# Patient Record
Sex: Female | Born: 1940 | Race: White | Hispanic: No | Marital: Single | Smoking: Never smoker
Health system: Southern US, Community
[De-identification: ages and names within clinical notes are randomized; demographics above are authoritative.]

## PROBLEM LIST (undated history)

## (undated) DIAGNOSIS — R0602 Shortness of breath: Secondary | ICD-10-CM

## (undated) DIAGNOSIS — D649 Anemia, unspecified: Secondary | ICD-10-CM

## (undated) DIAGNOSIS — IMO0001 Reserved for inherently not codable concepts without codable children: Secondary | ICD-10-CM

## (undated) DIAGNOSIS — I739 Peripheral vascular disease, unspecified: Secondary | ICD-10-CM

## (undated) DIAGNOSIS — J189 Pneumonia, unspecified organism: Secondary | ICD-10-CM

## (undated) DIAGNOSIS — J209 Acute bronchitis, unspecified: Secondary | ICD-10-CM

## (undated) DIAGNOSIS — M1990 Unspecified osteoarthritis, unspecified site: Secondary | ICD-10-CM

## (undated) DIAGNOSIS — E041 Nontoxic single thyroid nodule: Secondary | ICD-10-CM

## (undated) DIAGNOSIS — I10 Essential (primary) hypertension: Secondary | ICD-10-CM

## (undated) HISTORY — DX: Acute bronchitis, unspecified: J20.9

## (undated) HISTORY — DX: Peripheral vascular disease, unspecified: I73.9

## (undated) HISTORY — DX: Nontoxic single thyroid nodule: E04.1

## (undated) HISTORY — DX: Unspecified osteoarthritis, unspecified site: M19.90

## (undated) HISTORY — DX: Shortness of breath: R06.02

## (undated) HISTORY — DX: Reserved for inherently not codable concepts without codable children: IMO0001

## (undated) HISTORY — DX: Pneumonia, unspecified organism: J18.9

## (undated) HISTORY — DX: Anemia, unspecified: D64.9

## (undated) HISTORY — DX: Essential (primary) hypertension: I10

---

## 1999-05-16 HISTORY — PX: CHOLECYSTECTOMY: SHX55

## 2010-08-14 ENCOUNTER — Encounter: Payer: Self-pay | Admitting: Internal Medicine

## 2010-08-15 ENCOUNTER — Inpatient Hospital Stay (HOSPITAL_COMMUNITY)
Admission: RE | Admit: 2010-08-15 | Discharge: 2010-08-18 | Payer: Self-pay | Source: Home / Self Care | Admitting: Specialist

## 2010-10-20 ENCOUNTER — Other Ambulatory Visit (HOSPITAL_COMMUNITY): Payer: Self-pay | Admitting: Specialist

## 2010-10-20 ENCOUNTER — Ambulatory Visit (HOSPITAL_COMMUNITY)
Admission: RE | Admit: 2010-10-20 | Discharge: 2010-10-20 | Disposition: A | Discharge status: Home / Self Care | Payer: PRIVATE HEALTH INSURANCE | Source: Ambulatory Visit | Attending: Specialist | Admitting: Specialist

## 2010-10-20 ENCOUNTER — Ambulatory Visit (HOSPITAL_BASED_OUTPATIENT_CLINIC_OR_DEPARTMENT_OTHER)
Admission: RE | Admit: 2010-10-20 | Discharge: 2010-10-20 | Disposition: A | Discharge status: Home / Self Care | Payer: PRIVATE HEALTH INSURANCE | Attending: Specialist | Admitting: Specialist

## 2010-10-20 DIAGNOSIS — Z01818 Encounter for other preprocedural examination: Secondary | ICD-10-CM | POA: Insufficient documentation

## 2010-10-20 DIAGNOSIS — Z96659 Presence of unspecified artificial knee joint: Secondary | ICD-10-CM | POA: Insufficient documentation

## 2010-10-20 DIAGNOSIS — M24669 Ankylosis, unspecified knee: Secondary | ICD-10-CM | POA: Insufficient documentation

## 2010-10-20 DIAGNOSIS — Z01812 Encounter for preprocedural laboratory examination: Secondary | ICD-10-CM | POA: Insufficient documentation

## 2010-10-20 DIAGNOSIS — M4120 Other idiopathic scoliosis, site unspecified: Secondary | ICD-10-CM | POA: Insufficient documentation

## 2010-10-20 LAB — POCT I-STAT, CHEM 8
Calcium, Ion: 1.1 mmol/L — ABNORMAL LOW (ref 1.12–1.32)
Creatinine, Ser: 0.7 mg/dL (ref 0.4–1.2)
Glucose, Bld: 103 mg/dL — ABNORMAL HIGH (ref 70–99)
HCT: 41 % (ref 36.0–46.0)
Hemoglobin: 13.9 g/dL (ref 12.0–15.0)

## 2010-10-28 NOTE — Op Note (Signed)
  NAME:  Beverly Hart, Beverly Hart                ACCOUNT NO.:  [id number]  MEDICAL RECORD NO.:  [id number]           PATIENT TYPE:  O  LOCATION:  XRAY                         FACILITY:  [facility]  PHYSICIAN:  Jene Every, M.D.    DATE OF BIRTH:  [birth year]  DATE OF PROCEDURE: DATE OF DISCHARGE:                              OPERATIVE REPORT   PREOPERATIVE DIAGNOSIS:  Arthrofibrosis status post total knee replacement.  POSTOPERATIVE DIAGNOSIS:  Arthrofibrosis status post total knee replacement.  PROCEDURE PERFORMED: 1. Manipulation under anesthesia. 2. Intra-articular injection of lidocaine, right knee.  ANESTHESIA:  General.  SURGEON:  Jene Every, M.D.  ASSISTANT SURGEON:  No assistant.  BRIEF HISTORY:  This is a 70 year old who is status post knee replacement, went home and did not participate in physical therapy, developed a knee flexion contracture despite conservative treatment and was indicated for a manipulation.  Risks and benefits were discussed including bleeding, infection, fracture, inability to manipulate, need for open procedure, etc.  TECHNIQUE:  Placed in supine, and after induction with adequate anesthesia, we performed an exam under anesthesia.  She had approximately 85 degrees of flexion and -30 of extension.  I performed a gentle manipulation securing the tibia proximally with the flexed with a gentle axial load achieving 115 degrees of flexion.  I brought her into full extension, and again pressure to the distal femur and proximal tibia achieved at approximately -5 to 10 degrees of full extension. This maneuver was performed both in flexion/extension over a 10 minute period of time to achieve maximal result.  No fracture was appreciated. No difficulty achieving the motion.  Leg lengths were equivalent following that, and good pulses following that.  I sterilely prepped the knee in flexion and injected her with 10 mL of 1% lidocaine.  I placed a dressing, Ace, and  knee immobilizer.  She was transported to the recovery room in satisfactory condition.  The patient tolerated the procedure well.  No complications.  She will see a therapist tomorrow.     Jene Every, M.D.     Cordelia Pen  D:  10/20/2010  T:  10/20/2010  Job:  [id number]  Electronically Signed by Jene Every M.D. on 10/28/2010 12:17:11 PM

## 2010-11-25 LAB — URINALYSIS, ROUTINE W REFLEX MICROSCOPIC
Hgb urine dipstick: NEGATIVE
Nitrite: NEGATIVE
Protein, ur: NEGATIVE mg/dL
Urobilinogen, UA: 0.2 mg/dL (ref 0.0–1.0)

## 2010-11-25 LAB — CBC
HCT: 26.4 % — ABNORMAL LOW (ref 36.0–46.0)
HCT: 27.5 % — ABNORMAL LOW (ref 36.0–46.0)
HCT: 30.5 % — ABNORMAL LOW (ref 36.0–46.0)
Hemoglobin: 10.1 g/dL — ABNORMAL LOW (ref 12.0–15.0)
Hemoglobin: 8.6 g/dL — ABNORMAL LOW (ref 12.0–15.0)
MCH: 30.3 pg (ref 26.0–34.0)
MCH: 31.1 pg (ref 26.0–34.0)
MCH: 32.1 pg (ref 26.0–34.0)
MCHC: 33.1 g/dL (ref 30.0–36.0)
MCV: 92.7 fL (ref 78.0–100.0)
MCV: 92.9 fL (ref 78.0–100.0)
MCV: 93 fL (ref 78.0–100.0)
MCV: 92.8 fL (ref 78.0–100.0)
Platelets: 294 10*3/uL (ref 150–400)
RBC: 2.84 MIL/uL — ABNORMAL LOW (ref 3.87–5.11)
RBC: 2.96 MIL/uL — ABNORMAL LOW (ref 3.87–5.11)
RDW: 12.2 % (ref 11.5–15.5)
RDW: 13 % (ref 11.5–15.5)
WBC: 9.1 10*3/uL (ref 4.0–10.5)
WBC: 9.9 10*3/uL (ref 4.0–10.5)
WBC: 6.6 10*3/uL (ref 4.0–10.5)

## 2010-11-25 LAB — BASIC METABOLIC PANEL
BUN: 8 mg/dL (ref 6–23)
CO2: 27 mEq/L (ref 19–32)
CO2: 28 mEq/L (ref 19–32)
Chloride: 106 mEq/L (ref 96–112)
Chloride: 106 mEq/L (ref 96–112)
Chloride: 107 mEq/L (ref 96–112)
GFR calc Af Amer: >60 mL/min (ref >60)
GFR calc Af Amer: >60 mL/min (ref >60)
GFR calc non Af Amer: >60 mL/min (ref >60)
Glucose, Bld: 103 mg/dL — ABNORMAL HIGH (ref 70–99)
Glucose, Bld: 134 mg/dL — ABNORMAL HIGH (ref 70–99)
Potassium: 3.7 mEq/L (ref 3.5–5.1)
Potassium: 3.9 mEq/L (ref 3.5–5.1)
Potassium: 4.4 mEq/L (ref 3.5–5.1)
Sodium: 139 mEq/L (ref 135–145)
Sodium: 141 mEq/L (ref 135–145)

## 2010-11-25 LAB — COMPREHENSIVE METABOLIC PANEL
Albumin: 4.2 g/dL (ref 3.5–5.2)
BUN: 11 mg/dL (ref 6–23)
Creatinine, Ser: 0.61 mg/dL (ref 0.4–1.2)
Potassium: 4.5 mEq/L (ref 3.5–5.1)
Total Protein: 7.6 g/dL (ref 6.0–8.3)

## 2010-11-25 LAB — DIFFERENTIAL
Lymphocytes Relative: 28 % (ref 12–46)
Monocytes Absolute: 0.3 10*3/uL (ref 0.1–1.0)
Monocytes Relative: 5 % (ref 3–12)
Neutro Abs: 4.4 10*3/uL (ref 1.7–7.7)

## 2010-11-25 LAB — SURGICAL PCR SCREEN
MRSA, PCR: NEGATIVE
Staphylococcus aureus: NEGATIVE

## 2010-11-25 LAB — APTT: aPTT: 34 seconds (ref 24–37)

## 2010-11-25 LAB — PROTIME-INR
INR: 1.13 (ref 0.00–1.49)
Prothrombin Time: 23.9 seconds — ABNORMAL HIGH (ref 11.6–15.2)

## 2010-11-25 LAB — HEMOGLOBIN AND HEMATOCRIT, BLOOD: HCT: 29.1 % — ABNORMAL LOW (ref 36.0–46.0)

## 2010-11-25 LAB — TYPE AND SCREEN

## 2011-10-12 ENCOUNTER — Encounter: Payer: Self-pay | Admitting: Internal Medicine

## 2011-10-15 ENCOUNTER — Encounter: Payer: Self-pay | Admitting: Internal Medicine

## 2011-11-13 ENCOUNTER — Institutional Professional Consult (permissible substitution): Payer: PRIVATE HEALTH INSURANCE | Admitting: Internal Medicine

## 2011-11-24 ENCOUNTER — Institutional Professional Consult (permissible substitution): Payer: PRIVATE HEALTH INSURANCE | Admitting: Emergency Medicine

## 2011-11-25 ENCOUNTER — Telehealth: Payer: Self-pay | Admitting: Emergency Medicine

## 2011-11-25 NOTE — Telephone Encounter (Signed)
Phone note opened in error.  

## 2011-11-26 ENCOUNTER — Encounter: Payer: Self-pay | Admitting: Emergency Medicine

## 2011-11-27 ENCOUNTER — Institutional Professional Consult (permissible substitution): Payer: PRIVATE HEALTH INSURANCE | Admitting: Emergency Medicine

## 2011-12-07 ENCOUNTER — Ambulatory Visit (HOSPITAL_COMMUNITY)
Admission: RE | Admit: 2011-12-07 | Discharge: 2011-12-07 | Disposition: A | Discharge status: Home / Self Care | Payer: Commercial Managed Care - PPO | Source: Ambulatory Visit | Attending: Internal Medicine | Admitting: Internal Medicine

## 2011-12-07 ENCOUNTER — Encounter (HOSPITAL_COMMUNITY): Payer: Self-pay

## 2011-12-07 ENCOUNTER — Other Ambulatory Visit: Payer: Self-pay

## 2011-12-07 VITALS — BP 150/68 | HR 65 | Wt 146.5 lb

## 2011-12-07 DIAGNOSIS — R0609 Other forms of dyspnea: Secondary | ICD-10-CM | POA: Insufficient documentation

## 2011-12-07 DIAGNOSIS — I10 Essential (primary) hypertension: Secondary | ICD-10-CM | POA: Insufficient documentation

## 2011-12-07 DIAGNOSIS — I110 Hypertensive heart disease with heart failure: Secondary | ICD-10-CM | POA: Insufficient documentation

## 2011-12-07 DIAGNOSIS — R0989 Other specified symptoms and signs involving the circulatory and respiratory systems: Secondary | ICD-10-CM | POA: Insufficient documentation

## 2011-12-07 DIAGNOSIS — R0600 Dyspnea, unspecified: Secondary | ICD-10-CM | POA: Insufficient documentation

## 2011-12-07 MED ORDER — AMLODIPINE BESYLATE 10 MG PO TABS: 10.0000 mg | ORAL_TABLET | Freq: Every day | ORAL | Status: AC

## 2011-12-07 NOTE — Patient Instructions (Addendum)
Start Amlodipine 10 mg daily  Your physician recommends that you schedule a follow-up appointment in: 3 months

## 2011-12-07 NOTE — Assessment & Plan Note (Signed)
Currently has some fatigue which may be related to clonidine. She does not want to go back on lisinopril. We will try amlodipine 10 mg daily. Cut clonidine to 0.1 daily for 1 week. Then can d/c. Watch BP closely to make sure she doesn't need a 2nd agent.

## 2011-12-07 NOTE — Assessment & Plan Note (Signed)
I suspect most of her dyspnea was related to her PNA and now is improving rapidly. In reviewing her echo report she does appear to have some diastolic dysfunction but volume status currently well controlled and no overt HF at this point. Can continue using lasix as needed to keep weight stable. I suspect she may have some underlying lung disease despite being a life-long non-smoker and we will await PFTs with Dr. Delton Coombes.

## 2011-12-07 NOTE — Progress Notes (Signed)
Patient ID: Beverly Hart, female   DOB: April 08, 1941, 71 y.o.   MRN: [id number]     Referring Physician:  [name], MD Primary Physician: [name], MD Primary Cardiologist: Reason for Consultation: HF   HPI:  Beverly Hart is a 70 y/o woman with h/o HTN, HL and depression. She denies h/o known CAD or HF. Had syncopal episode due to HCTZ in 2004 which was normal. Then had Myoview in 08/2010 (pre-op R TKA) which was normal. Never had a cath.  Admitted to [facility] in 1/13 with respiratory failure due to pneumonia complicated by what appears to be acute diastolic HF and mild renal failure with Cr 1.5. SBP > 200.  Lisinopril stopped and clonidine started.  Echo showed normal LV function + diastolic dysfunction. Normal RV. Mild TR. On d/c summary states she has COPD with flare but denies h/o smoking or asthma. Not a lot of second-hand smoke. On discharge sats went to 78% so was discharged on home O2. Readmitted two weeks later on February 18 with recurrent PNA. Treated again with abx. Weaning O2 now down to 1/2 L of O2 at night. Scheduled to see Dr. Delton Coombes for PFTs and CXR  4/24. Has worked in a Theme park manager for many years.   Denies CP, edema, orthopnea or PND. Says she can walk a pretty good distance without problem. Has not had f/u CXR yet. SBP running 120s at home. Has white-coat syndrome here. Taking lasix every other day. Weighing every day - very stable.     Review of Systems:     Cardiac Review of Systems: {Y] = yes [ ]  = no  Chest Pain [    ]  Resting SOB [   ] Exertional SOB  [  ]  Orthopnea [  ]   Pedal Edema [   ]    Palpitations [  ] Syncope  [  ]   Presyncope [   ]  General Review of Systems: [Y] = yes [  ]=no Constitional: recent weight change [  ]; anorexia [  ]; fatigue [  ]; nausea [  ]; night sweats [  ]; fever [  ]; or chills [  ];                                                                                                                 Eye : blurred vision [  ];  diplopia [   ]; vision changes [  ];  Amaurosis fugax[  ]; Resp: cough [  ];  wheezing[  ];  hemoptysis[  ]; shortness of breath[  ]; paroxysmal nocturnal dyspnea[  ]; dyspnea on exertion[  ]; or orthopnea[  ];  GI:  gallstones[  ], vomiting[  ];  dysphagia[  ]; melena[  ];  hematochezia [  ]; heartburn[  ];   Hx of  Colonoscopy[  ]; GU: kidney stones [  ]; hematuria[  ];   dysuria [  ];  nocturia[  ];  history of  obstruction [  ];                 Skin: rash, swelling[  ];, hair loss[  ];  peripheral edema[  ];  or itching[  ]; Musculosketetal: myalgias[  ];  joint swelling[  ];  joint erythema[  ];  joint pain[ y ];  back pain[  ];  Heme/Lymph: bruising[  ];  bleeding[  ];  anemia[  ];  Neuro: TIA[  ];  headaches[  ];  stroke[  ];  vertigo[  ];  seizures[  ];   paresthesias[  ];  difficulty walking[  ];  Psych:depression[y  ]; anxiety[  ];  Endocrine: diabetes[  ];  thyroid dysfunction[  ];  Other:  Past Medical History  Diagnosis Date  . Acute bronchitis   . Anemia   . Community acquired pneumonia   . Hypertension   . Myalgia and myositis   . Osteoarthritis   . Peripheral vascular disease     noted on carotid ultrasound  . SOB (shortness of breath)   . Solitary thyroid nodule     hyperdense on the right lobe     Medications Prior to Admission  Medication Sig Dispense Refill  . calcium carbonate (OS-CAL) 600 MG TABS Take 600 mg by mouth 2 (two) times daily with a meal.      . cholecalciferol (VITAMIN D) 1000 UNITS tablet Take 2,000 Units by mouth daily.       . citalopram (CELEXA) 10 MG tablet Take 10 mg by mouth daily.      . cloNIDine (CATAPRES) 0.1 MG tablet Take 0.1 mg by mouth 2 (two) times daily.      Donald Prose (FLORA-Q) CAPS Take by mouth.      . furosemide (LASIX) 40 MG tablet Take 20 mg by mouth. Every Mon, Wed and Fri      . Glucosamine-Chondroit-Vit C-Mn (GLUCOSAMINE 1500 COMPLEX PO) Take 1,500 mg by mouth.      . Multiple Vitamin (MULTIVITAMIN) capsule Take 1  capsule by mouth daily.       No current facility-administered medications on file as of 12/07/2011.     Allergies  Allergen Reactions  . Penicillins     Rash   . Sulfa Antibiotics     Rash     History   Social History  . Marital Status: Single    Spouse Name: N/A    Number of Children: N/A  . Years of Education: N/A   Occupational History  . Not on file.   Social History Main Topics  . Smoking status: Never Smoker   . Smokeless tobacco: Never Used  . Alcohol Use: No  . Drug Use: No  . Sexually Active: Not on file   Other Topics Concern  . Not on file   Social History Narrative  . No narrative on file    No family history on file.  PHYSICAL EXAM: Filed Vitals:   12/07/11 1459  BP: 150/68  Pulse: 65    No intake or output data in the 24 hours ending 12/07/11 1524  General:  Elderly Well appearing. No respiratory difficulty HEENT: normal. Wearing O2 Neck: supple. no JVD. Carotids 2+ bilat; no bruits. No lymphadenopathy or thryomegaly appreciated. Cor: PMI nondisplaced. Regular rate & rhythm. No rubs or murmurs. +s4 Lungs: clear with long expiratory phase. No wheeze. Abdomen: soft, nontender, nondistended. No hepatosplenomegaly. No bruits or masses. Good bowel sounds. Extremities: no cyanosis, clubbing, rash, edema Neuro: alert & oriented  x 3, cranial nerves grossly intact. moves all 4 extremities w/o difficulty. Affect pleasant.  ECG: Sinus brady 49 No ST-T wave abnormalities.    No results found for this or any previous visit (from the past 24 hour(s)). No results found.   ASSESSMENT:

## 2011-12-09 NOTE — Progress Notes (Signed)
Encounter addended by: Cresenciano Genre on: 12/09/2011  7:46 AM<BR>     Documentation filed: Charges VN

## 2012-01-06 ENCOUNTER — Ambulatory Visit (INDEPENDENT_AMBULATORY_CARE_PROVIDER_SITE_OTHER): Payer: Commercial Managed Care - PPO | Admitting: Emergency Medicine

## 2012-01-06 ENCOUNTER — Encounter: Payer: Self-pay | Admitting: Emergency Medicine

## 2012-01-06 VITALS — BP 120/60 | HR 48 | Temp 98.2°F | Ht 62.0 in | Wt 156.2 lb

## 2012-01-06 DIAGNOSIS — I5030 Unspecified diastolic (congestive) heart failure: Secondary | ICD-10-CM

## 2012-01-06 DIAGNOSIS — R0609 Other forms of dyspnea: Secondary | ICD-10-CM

## 2012-01-06 DIAGNOSIS — R0600 Dyspnea, unspecified: Secondary | ICD-10-CM

## 2012-01-06 NOTE — Progress Notes (Signed)
Subjective:    Patient ID: Beverly Hart, female    DOB: [birth year], 71 y.o.   MRN: [id number]  HPI 71 yo woman, never smoker, hx HTN, hyperlipidemia. Admitted with bilateral infiltrates and hypoxemic resp failure to [facility] in January 2013. She reports that she was well leading up to the day of this admission. She developed acute respiratory distress without any fever, chills, cough, sputum production or other infectious prodrome. On initial evaluation her systolic blood pressure was greater than 200. She was treated for a possible community-acquired pneumonia although her overall presentation appears to be more consistent with acute diastolic dysfunction and pulmonary edema. Her course was complicated by mild renal insufficiency with a serum creatinine of 1.5. Notation was made of possible COPD with acute exacerbation contributing to her decompensation although she has no history of tobacco use or asthma. She improved with diuresis and was discharged home but was unfortunately readmitted 2 weeks later, this time with some cough and associated fever. Her bilateral infiltrates were asymmetric and she was again treated for possible pneumonia.   She has been evaluated by cardiology and has an echocardiogram that showed diastolic dysfunction with preserved LVEF. She had mild TR and her right ventricular function was felt to be normal. She was discharged on home oxygen but has weaned herself off of this. Her exercise tolerance and overall pulmonary status has improved with diuresis and better blood pressure control. She presents now for evaluation of possible underlying lung disease. She is much improved and feels like she is approaching her previous baseline.   Review of Systems  Constitutional: Negative.  Negative for fever and unexpected weight change.  HENT: Positive for congestion. Negative for ear pain, nosebleeds, sore throat, rhinorrhea, sneezing, trouble swallowing, dental problem,  postnasal drip and sinus pressure.   Eyes: Negative.  Negative for redness and itching.  Respiratory: Positive for cough and shortness of breath. Negative for chest tightness and wheezing.   Cardiovascular: Positive for leg swelling. Negative for palpitations.  Gastrointestinal: Negative.  Negative for nausea and vomiting.  Genitourinary: Negative.  Negative for dysuria.  Musculoskeletal: Positive for arthralgias. Negative for joint swelling.  Skin: Negative.  Negative for rash.  Neurological: Negative.  Negative for headaches.  Hematological: Negative.  Does not bruise/bleed easily.  Psychiatric/Behavioral: Negative for dysphoric mood. The patient is nervous/anxious.     Past Medical History  Diagnosis Date  . Acute bronchitis   . Anemia   . Community acquired pneumonia   . Hypertension   . Myalgia and myositis   . Osteoarthritis   . Peripheral vascular disease     noted on carotid ultrasound  . SOB (shortness of breath)   . Solitary thyroid nodule     hyperdense on the right lobe      Family History  Problem Relation Age of Onset  . Emphysema Father   . Breast cancer Mother      History   Social History  . Marital Status: Single    Spouse Name: N/A    Number of Children: N/A  . 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 Smoking status: Never Smoker   . Smokeless tobacco: Never Used  . Alcohol Use: No  . Drug Use: No  . Sexually Active: Not on file   Other Topics Concern  . Not on file   Social History Narrative  . No narrative on file     Allergies  Allergen Reactions  . Penicillins     Rash   . Sulfa Antibiotics     Rash      Outpatient Prescriptions Prior to Visit  Medication Sig Dispense Refill  . aspirin 81 MG tablet Take 81 mg by mouth daily.      Marland Kitchen b complex vitamins tablet Take 2 tablets by mouth daily.      . calcium carbonate (OS-CAL) 600 MG TABS Take 600 mg by mouth 2  (two) times daily with a meal.      . cholecalciferol (VITAMIN D) 1000 UNITS tablet Take 2,000 Units by mouth daily.       . citalopram (CELEXA) 10 MG tablet Take 10 mg by mouth daily.      . cloNIDine (CATAPRES) 0.1 MG tablet Take 0.1 mg by mouth 2 (two) times daily.      . furosemide (LASIX) 40 MG tablet Take 20 mg by mouth. Every Mon, Wed and Fri      . Glucosamine-Chondroit-Vit C-Mn (GLUCOSAMINE 1500 COMPLEX PO) Take 1,500 mg by mouth.      . metoprolol (LOPRESSOR) 50 MG tablet Take 50 mg by mouth 2 (two) times daily.      . Multiple Vitamin (MULTIVITAMIN) capsule Take 1 capsule by mouth daily.      . rosuvastatin (CRESTOR) 10 MG tablet Take 10 mg by mouth daily.      Marland Kitchen amLODipine (NORVASC) 10 MG tablet Take 1 tablet (10 mg total) by mouth daily.  30 tablet  6  . Flora-Q (FLORA-Q) CAPS Take by mouth.      Marland Kitchen LORazepam (ATIVAN) 0.5 MG tablet Take 0.5 mg by mouth at bedtime.            Objective:   Physical Exam  Gen: Pleasant, well-nourished, in no distress,  normal affect  ENT: No lesions,  mouth clear,  oropharynx clear, no postnasal drip  Neck: No JVD, no TMG, no carotid bruits  Lungs: No use of accessory muscles, no dullness to percussion, clear without rales or rhonchi  Cardiovascular: RRR, heart sounds normal, no murmur or gallops, no peripheral edema  Musculoskeletal: No deformities, no cyanosis or clubbing  Neuro: alert, non focal  Skin: Warm, no lesions or rashes     Assessment & Plan:  Dyspnea After reviewing clinical hx and her CXR's I believe her initial admission to [facility] and her dyspnea relate to pulmonary edema due to diastolic failure and hypertensive crisis. In retrospect hard to know if she had PNA although agree she was appropriately treated. She was readmitted after 2 weeks and sounds as though she may have had infectious process on this occasion. Was d/c with O2 and a dx of COPD despite no tobacco hx. Her CXR has cleared.  - full PFT to prove or  disprove AFL - walking oximetry shows that she does not desaturate - BP control and CHF management per Dr Bensimhon's plans

## 2012-01-06 NOTE — Patient Instructions (Signed)
We will perform full Pulmonary Function testing at your next office visit Your oximetry shows that you you do not need oxygen when walking anymore.  Continue your blood pressure meds as directed by Dr Gala Romney.  Follow with Dr Delton Coombes next available with PFT

## 2012-01-06 NOTE — Assessment & Plan Note (Addendum)
After reviewing clinical hx and her CXR's I believe her initial admission to [facility] and her dyspnea relate to pulmonary edema due to diastolic failure and hypertensive crisis. In retrospect hard to know if she had PNA although agree she was appropriately treated. She was readmitted after 2 weeks and sounds as though she may have had infectious process on this occasion. Was d/c with O2 and a dx of COPD despite no tobacco hx. Her CXR has cleared.  - full PFT to prove or disprove AFL - walking oximetry shows that she does not desaturate - BP control and CHF management per Dr Bensimhon's plans

## 2012-01-07 DIAGNOSIS — I5030 Unspecified diastolic (congestive) heart failure: Secondary | ICD-10-CM | POA: Insufficient documentation

## 2012-01-08 ENCOUNTER — Encounter: Payer: Self-pay | Admitting: Internal Medicine

## 2012-01-28 ENCOUNTER — Ambulatory Visit: Payer: Commercial Managed Care - PPO | Admitting: Emergency Medicine

## 2012-02-04 ENCOUNTER — Encounter: Payer: Self-pay | Admitting: Internal Medicine

## 2012-02-11 IMAGING — CR DG KNEE 1-2V*R*
2 series · 2 of 2 positions shown · non-contrast
Comparison: None.

CLINICAL DATA: Preoperative film.

RIGHT KNEE - 1-2 VIEW

[w knee ap right *]
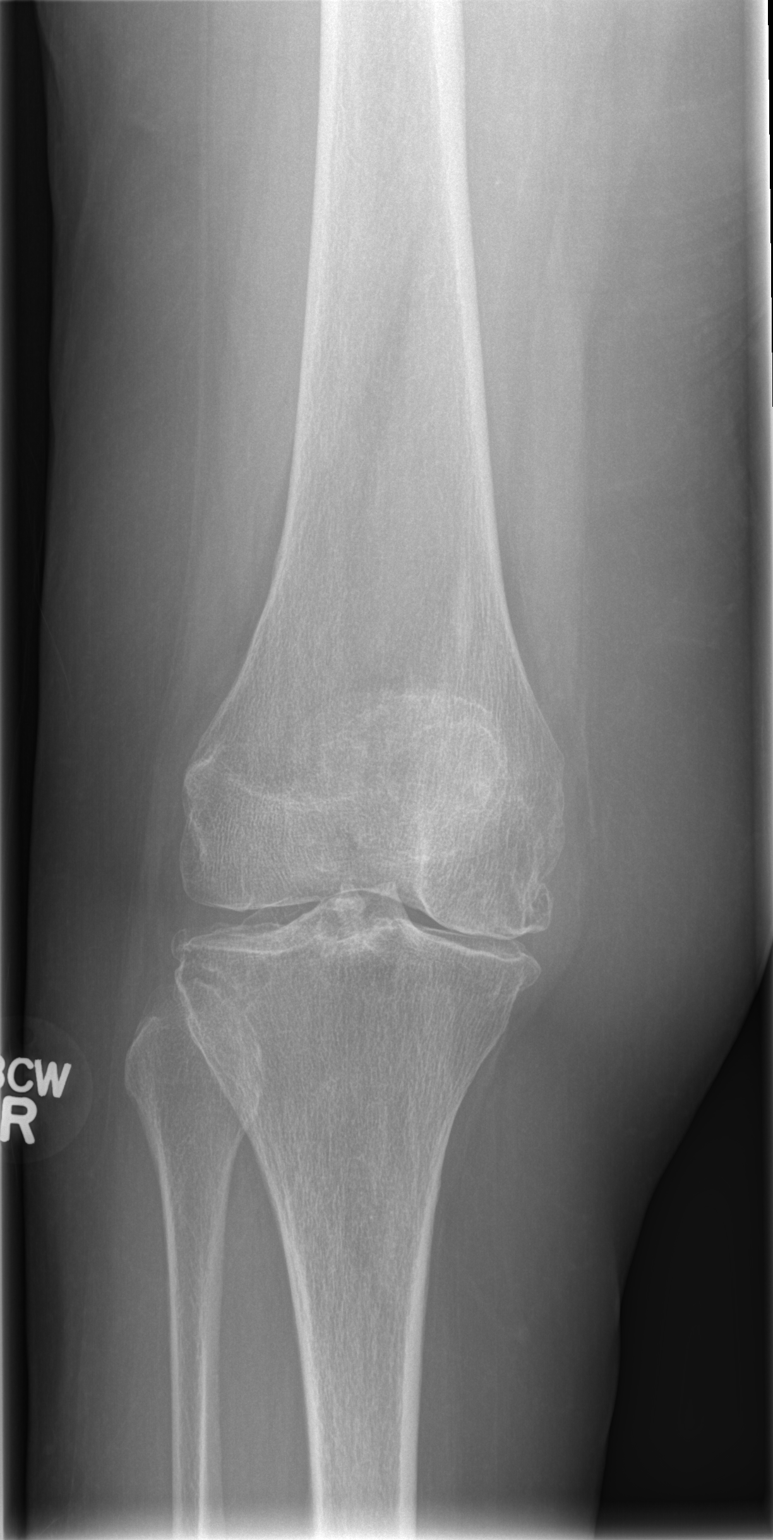

[w knee lat. right]
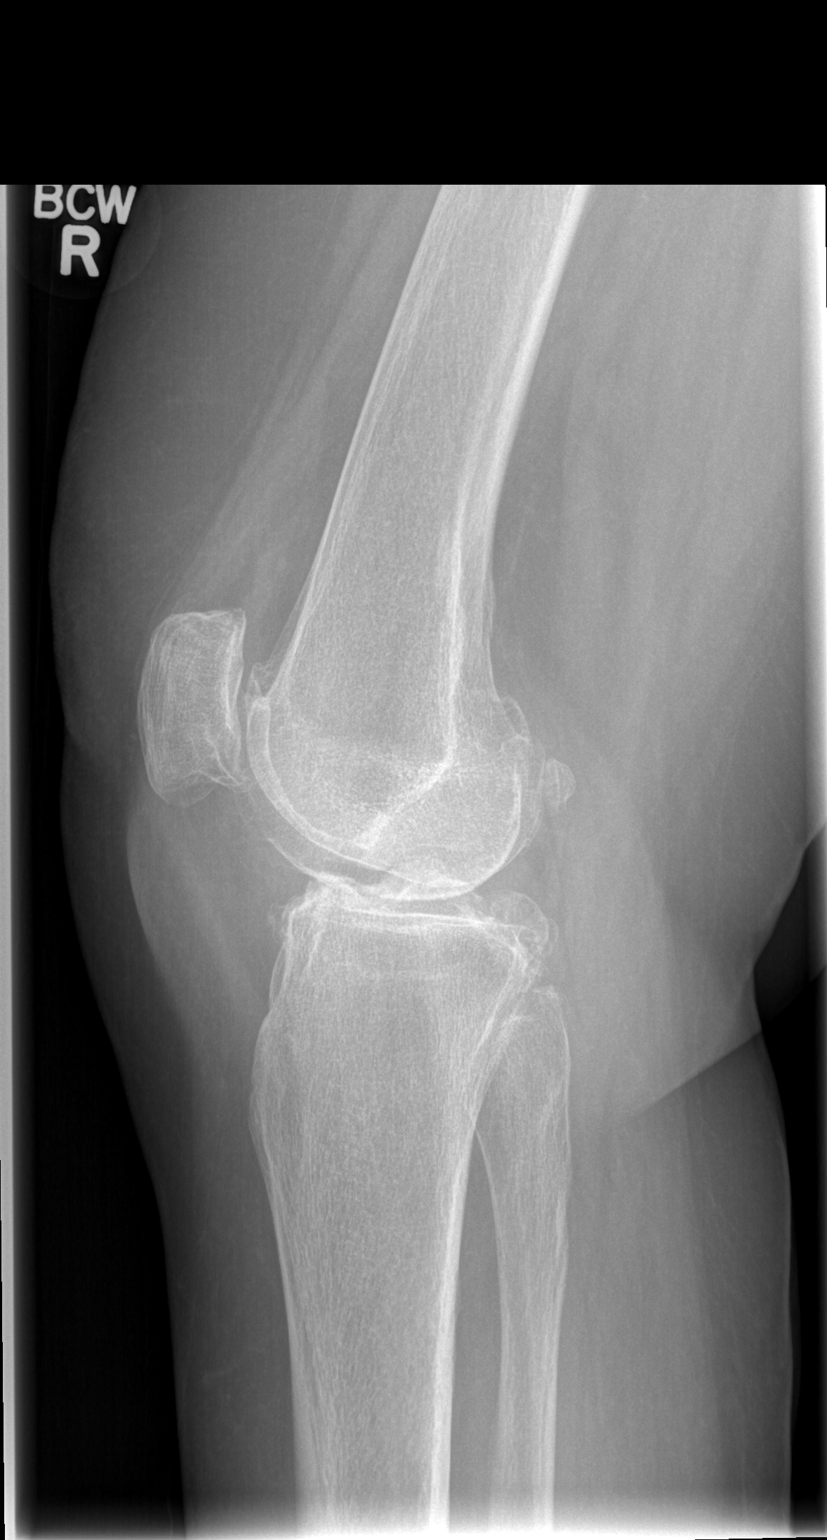

[2 of 2 positions shown; findings below may reference images not displayed]

FINDINGS: No fracture or dislocation is identified.  The patient
has advanced tricompartmental degenerative disease.  Scant amount
of joint fluid noted.
IMPRESSION: 1.  No acute finding.
2.  Advanced osteoarthritis.

## 2012-02-11 IMAGING — CR DG CHEST 2V
2 series · 2 of 2 positions shown · non-contrast
Comparison: None.

CLINICAL DATA: Preoperative film.

CHEST - 2 VIEW

[w chest pa]
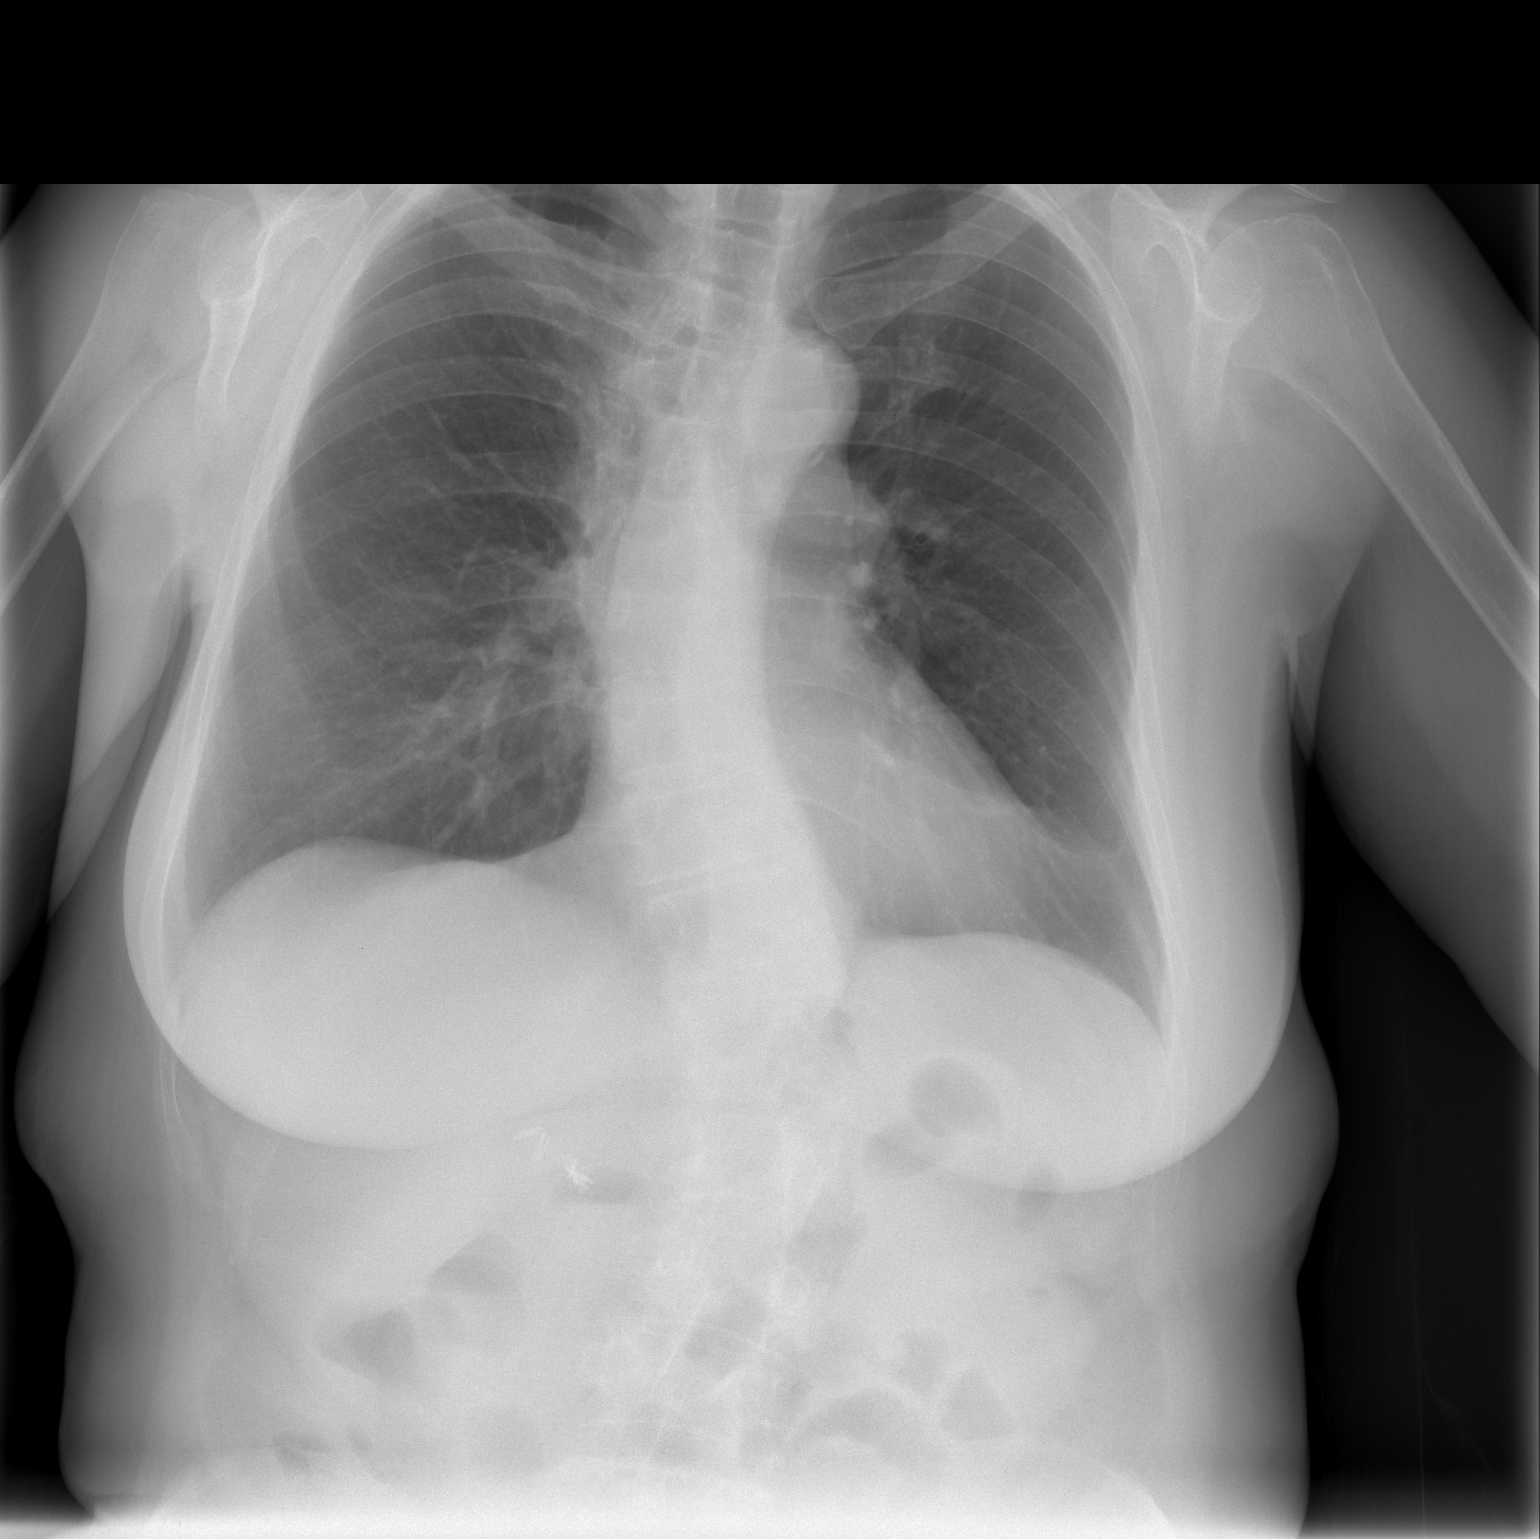

[w chest lat]
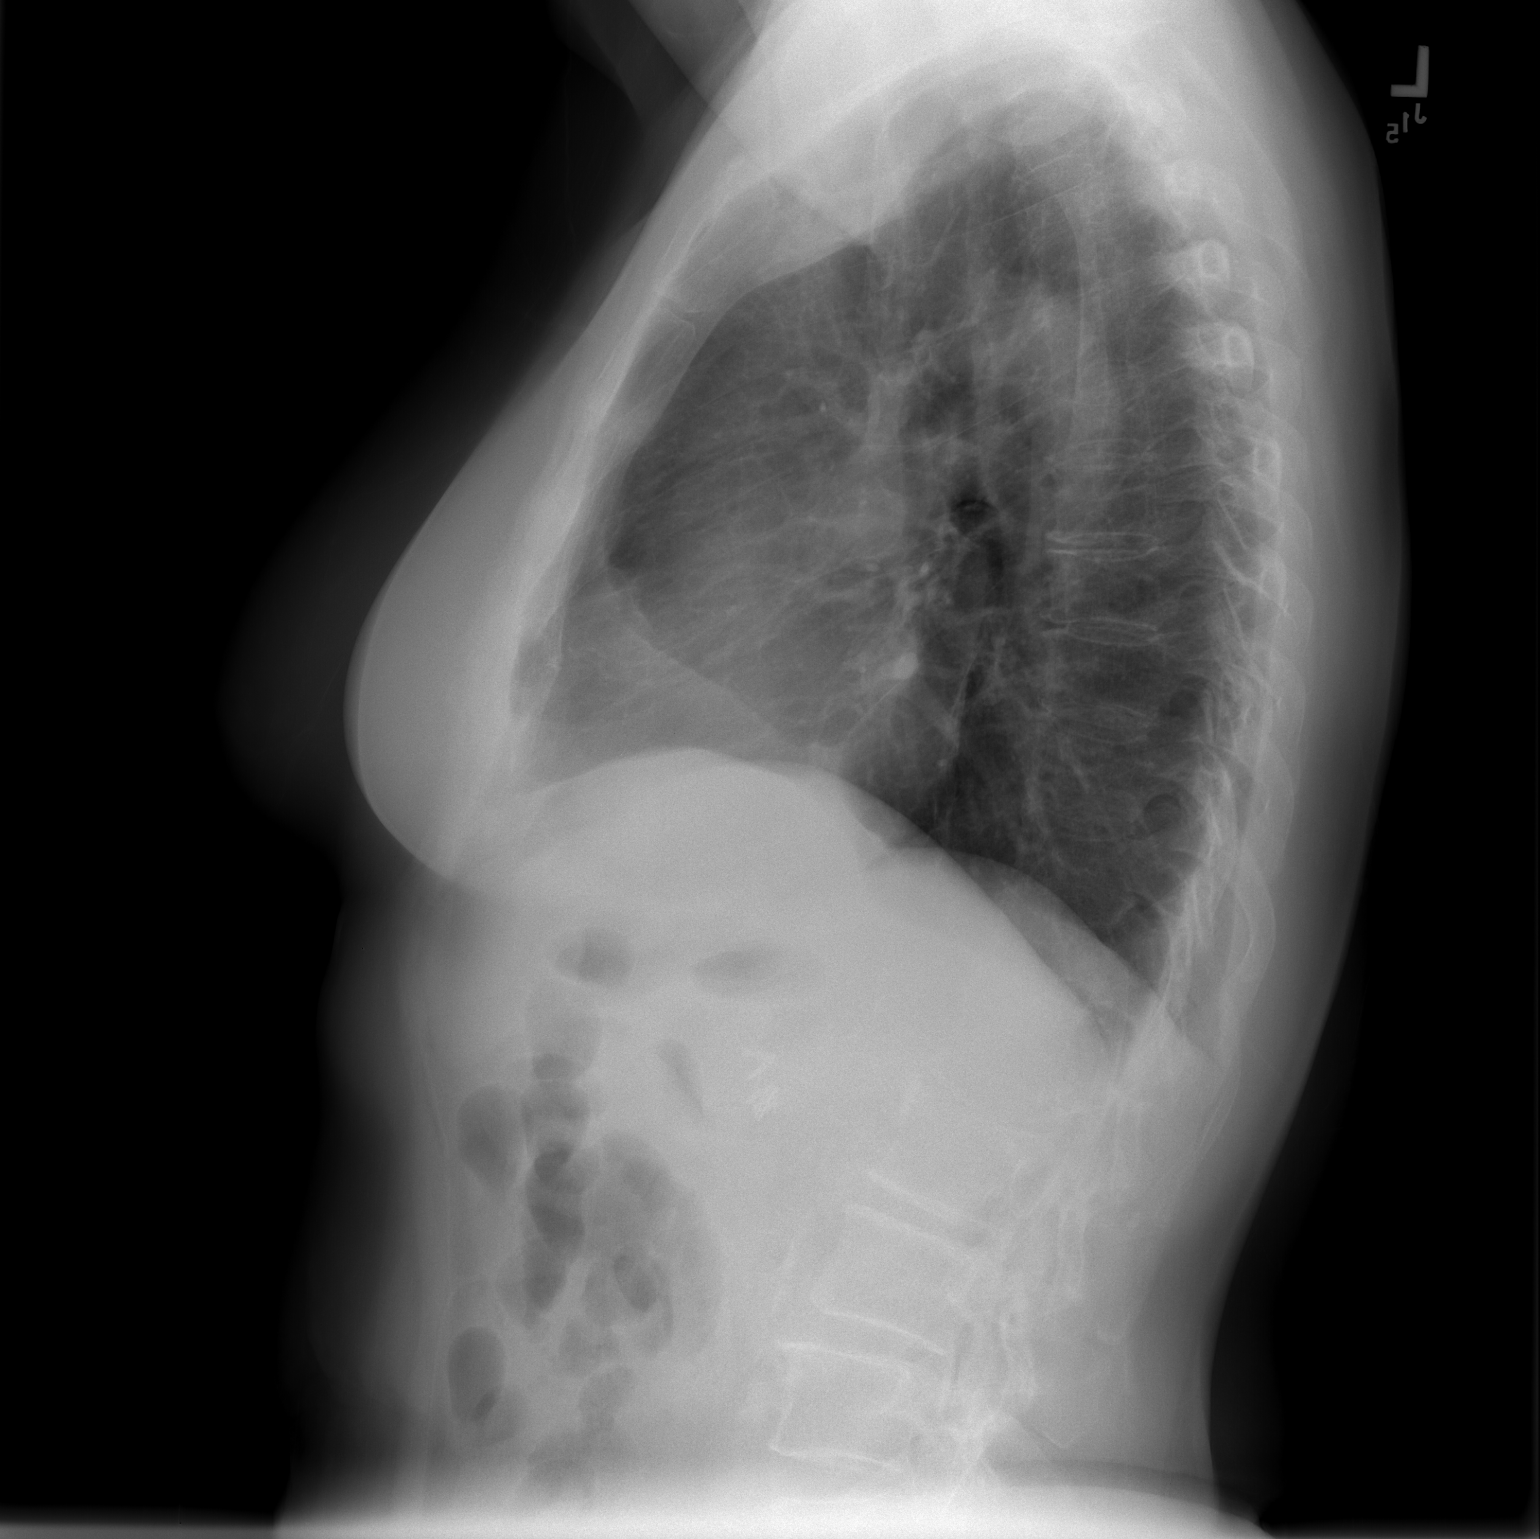

[2 of 2 positions shown; findings below may reference images not displayed]

FINDINGS: Lungs are clear.  No pneumothorax or pleural effusion.
Heart size normal.  Marked thoracolumbar scoliosis noted.
IMPRESSION: No acute disease.  Scoliosis.

## 2012-03-29 ENCOUNTER — Ambulatory Visit: Payer: Commercial Managed Care - PPO | Admitting: Emergency Medicine

## 2013-04-05 ENCOUNTER — Telehealth: Payer: Self-pay | Admitting: Emergency Medicine

## 2013-04-05 NOTE — Telephone Encounter (Signed)
LMTCBx1, pt last seen 12-2011. Carron Curie, CMA

## 2013-04-06 ENCOUNTER — Inpatient Hospital Stay (HOSPITAL_COMMUNITY)
Admission: EM | Admit: 2013-04-06 | Discharge: 2013-04-11 | DRG: 291 | Disposition: A | Discharge status: Home / Self Care | Payer: Commercial Managed Care - PPO | Source: Other Acute Inpatient Hospital | Attending: Internal Medicine | Admitting: Internal Medicine

## 2013-04-06 DIAGNOSIS — Z9089 Acquired absence of other organs: Secondary | ICD-10-CM

## 2013-04-06 DIAGNOSIS — I5030 Unspecified diastolic (congestive) heart failure: Secondary | ICD-10-CM | POA: Diagnosis present

## 2013-04-06 DIAGNOSIS — J9600 Acute respiratory failure, unspecified whether with hypoxia or hypercapnia: Secondary | ICD-10-CM | POA: Diagnosis present

## 2013-04-06 DIAGNOSIS — Z7982 Long term (current) use of aspirin: Secondary | ICD-10-CM

## 2013-04-06 DIAGNOSIS — A4902 Methicillin resistant Staphylococcus aureus infection, unspecified site: Secondary | ICD-10-CM | POA: Diagnosis present

## 2013-04-06 DIAGNOSIS — D696 Thrombocytopenia, unspecified: Secondary | ICD-10-CM | POA: Diagnosis present

## 2013-04-06 DIAGNOSIS — I739 Peripheral vascular disease, unspecified: Secondary | ICD-10-CM | POA: Diagnosis present

## 2013-04-06 DIAGNOSIS — J90 Pleural effusion, not elsewhere classified: Secondary | ICD-10-CM

## 2013-04-06 DIAGNOSIS — I10 Essential (primary) hypertension: Secondary | ICD-10-CM | POA: Diagnosis present

## 2013-04-06 DIAGNOSIS — I5031 Acute diastolic (congestive) heart failure: Secondary | ICD-10-CM

## 2013-04-06 DIAGNOSIS — Z880 Allergy status to penicillin: Secondary | ICD-10-CM

## 2013-04-06 DIAGNOSIS — N289 Disorder of kidney and ureter, unspecified: Secondary | ICD-10-CM | POA: Diagnosis present

## 2013-04-06 DIAGNOSIS — J9601 Acute respiratory failure with hypoxia: Secondary | ICD-10-CM | POA: Diagnosis present

## 2013-04-06 DIAGNOSIS — J189 Pneumonia, unspecified organism: Secondary | ICD-10-CM | POA: Diagnosis present

## 2013-04-06 DIAGNOSIS — I5033 Acute on chronic diastolic (congestive) heart failure: Principal | ICD-10-CM | POA: Diagnosis present

## 2013-04-06 DIAGNOSIS — J438 Other emphysema: Secondary | ICD-10-CM | POA: Diagnosis present

## 2013-04-06 DIAGNOSIS — Z882 Allergy status to sulfonamides status: Secondary | ICD-10-CM

## 2013-04-06 DIAGNOSIS — I110 Hypertensive heart disease with heart failure: Secondary | ICD-10-CM | POA: Diagnosis present

## 2013-04-06 DIAGNOSIS — R0600 Dyspnea, unspecified: Secondary | ICD-10-CM

## 2013-04-06 DIAGNOSIS — I509 Heart failure, unspecified: Secondary | ICD-10-CM

## 2013-04-06 LAB — CREATININE, SERUM: GFR calc Af Amer: >90 mL/min (ref >90)

## 2013-04-06 LAB — CBC
HCT: 37.1 % (ref 36.0–46.0)
MCV: 89.4 fL (ref 78.0–100.0)
RDW: 12.2 % (ref 11.5–15.5)
WBC: 11 10*3/uL — ABNORMAL HIGH (ref 4.0–10.5)

## 2013-04-06 LAB — PRO B NATRIURETIC PEPTIDE: Pro B Natriuretic peptide (BNP): 294.2 pg/mL — ABNORMAL HIGH (ref 0–125)

## 2013-04-06 MED ORDER — VITAMIN D3 25 MCG (1000 UNIT) PO TABS
2000.0000 [IU] | ORAL_TABLET | Freq: Every day | ORAL | Status: DC
  Administered 2013-04-07 – 2013-04-11 (×5): 2000 [IU] via ORAL
  Filled 2013-04-06 (×6): qty 2

## 2013-04-06 MED ORDER — SORBITOL 70 % SOLN
30.0000 mL | Freq: Every day | Status: DC | PRN
  Filled 2013-04-06: qty 30

## 2013-04-06 MED ORDER — ASPIRIN 81 MG PO CHEW
81.0000 mg | CHEWABLE_TABLET | Freq: Every day | ORAL | Status: DC
  Administered 2013-04-07 – 2013-04-11 (×5): 81 mg via ORAL
  Filled 2013-04-06 (×5): qty 1

## 2013-04-06 MED ORDER — ACETAMINOPHEN 325 MG PO TABS: 650.0000 mg | ORAL_TABLET | Freq: Four times a day (QID) | ORAL | Status: DC | PRN

## 2013-04-06 MED ORDER — DOCUSATE SODIUM 100 MG PO CAPS
100.0000 mg | ORAL_CAPSULE | Freq: Two times a day (BID) | ORAL | Status: DC
  Administered 2013-04-06 – 2013-04-11 (×10): 100 mg via ORAL
  Filled 2013-04-06 (×10): qty 1

## 2013-04-06 MED ORDER — FUROSEMIDE 10 MG/ML IJ SOLN
20.0000 mg | Freq: Two times a day (BID) | INTRAMUSCULAR | Status: DC
  Administered 2013-04-07: 20 mg via INTRAVENOUS
  Filled 2013-04-06 (×3): qty 2

## 2013-04-06 MED ORDER — METOPROLOL TARTRATE 50 MG PO TABS
50.0000 mg | ORAL_TABLET | Freq: Two times a day (BID) | ORAL | Status: DC
  Administered 2013-04-06 – 2013-04-11 (×10): 50 mg via ORAL
  Filled 2013-04-06 (×11): qty 1

## 2013-04-06 MED ORDER — CALCIUM CARBONATE-VITAMIN D 500-200 MG-UNIT PO TABS
1.0000 | ORAL_TABLET | Freq: Two times a day (BID) | ORAL | Status: DC
  Administered 2013-04-07 – 2013-04-11 (×10): 1 via ORAL
  Filled 2013-04-06 (×13): qty 1

## 2013-04-06 MED ORDER — CLONIDINE HCL 0.1 MG PO TABS
0.1000 mg | ORAL_TABLET | Freq: Two times a day (BID) | ORAL | Status: DC
  Administered 2013-04-06 – 2013-04-11 (×10): 0.1 mg via ORAL
  Filled 2013-04-06 (×11): qty 1

## 2013-04-06 MED ORDER — DEXTROSE 5 % IV SOLN
2.0000 g | INTRAVENOUS | Status: DC
  Administered 2013-04-06 – 2013-04-09 (×4): 2 g via INTRAVENOUS
  Filled 2013-04-06 (×7): qty 2

## 2013-04-06 MED ORDER — SODIUM CHLORIDE 0.9 % IJ SOLN
3.0000 mL | Freq: Two times a day (BID) | INTRAMUSCULAR | Status: DC
  Administered 2013-04-07 – 2013-04-11 (×6): 3 mL via INTRAVENOUS

## 2013-04-06 MED ORDER — CHLORHEXIDINE GLUCONATE CLOTH 2 % EX PADS
6.0000 | MEDICATED_PAD | Freq: Every day | CUTANEOUS | Status: DC
  Administered 2013-04-07 – 2013-04-11 (×4): 6 via TOPICAL

## 2013-04-06 MED ORDER — IPRATROPIUM BROMIDE 0.02 % IN SOLN: 0.5000 mg | RESPIRATORY_TRACT | Status: DC | PRN

## 2013-04-06 MED ORDER — POLYETHYLENE GLYCOL 3350 17 G PO PACK
17.0000 g | PACK | Freq: Every day | ORAL | Status: DC | PRN
  Filled 2013-04-06: qty 1

## 2013-04-06 MED ORDER — LEVOFLOXACIN IN D5W 750 MG/150ML IV SOLN
750.0000 mg | INTRAVENOUS | Status: DC
  Administered 2013-04-07 – 2013-04-10 (×4): 750 mg via INTRAVENOUS
  Filled 2013-04-06 (×9): qty 150

## 2013-04-06 MED ORDER — POTASSIUM CHLORIDE CRYS ER 10 MEQ PO TBCR
10.0000 meq | EXTENDED_RELEASE_TABLET | Freq: Every day | ORAL | Status: DC
  Administered 2013-04-07 – 2013-04-10 (×4): 10 meq via ORAL
  Filled 2013-04-06 (×5): qty 1

## 2013-04-06 MED ORDER — ENOXAPARIN SODIUM 40 MG/0.4ML ~~LOC~~ SOLN
40.0000 mg | SUBCUTANEOUS | Status: DC
  Administered 2013-04-07 – 2013-04-08 (×2): 40 mg via SUBCUTANEOUS
  Filled 2013-04-06 (×4): qty 0.4

## 2013-04-06 MED ORDER — ACETAMINOPHEN 650 MG RE SUPP: 650.0000 mg | Freq: Four times a day (QID) | RECTAL | Status: DC | PRN

## 2013-04-06 MED ORDER — ONDANSETRON HCL 4 MG/2ML IJ SOLN: 4.0000 mg | Freq: Four times a day (QID) | INTRAMUSCULAR | Status: DC | PRN

## 2013-04-06 MED ORDER — ALBUTEROL SULFATE (5 MG/ML) 0.5% IN NEBU: 2.5000 mg | INHALATION_SOLUTION | RESPIRATORY_TRACT | Status: DC | PRN

## 2013-04-06 MED ORDER — KETOROLAC TROMETHAMINE 15 MG/ML IJ SOLN
15.0000 mg | Freq: Four times a day (QID) | INTRAMUSCULAR | Status: DC | PRN
  Administered 2013-04-06: 15 mg via INTRAVENOUS
  Filled 2013-04-06: qty 1

## 2013-04-06 MED ORDER — MUPIROCIN 2 % EX OINT
1.0000 "application " | TOPICAL_OINTMENT | Freq: Two times a day (BID) | CUTANEOUS | Status: AC
  Administered 2013-04-07 – 2013-04-11 (×10): 1 via NASAL
  Filled 2013-04-06: qty 22

## 2013-04-06 MED ORDER — CITALOPRAM HYDROBROMIDE 10 MG PO TABS
10.0000 mg | ORAL_TABLET | Freq: Every day | ORAL | Status: DC
  Administered 2013-04-07 – 2013-04-11 (×5): 10 mg via ORAL
  Filled 2013-04-06 (×6): qty 1

## 2013-04-06 MED ORDER — HYDROCODONE-ACETAMINOPHEN 5-325 MG PO TABS: 1.0000 | ORAL_TABLET | ORAL | Status: DC | PRN

## 2013-04-06 MED ORDER — VANCOMYCIN HCL IN DEXTROSE 750-5 MG/150ML-% IV SOLN
750.0000 mg | Freq: Two times a day (BID) | INTRAVENOUS | Status: DC
  Administered 2013-04-06 – 2013-04-10 (×8): 750 mg via INTRAVENOUS
  Filled 2013-04-06 (×9): qty 150

## 2013-04-06 MED ORDER — ATORVASTATIN CALCIUM 10 MG PO TABS
10.0000 mg | ORAL_TABLET | Freq: Every day | ORAL | Status: DC
  Administered 2013-04-07 – 2013-04-11 (×5): 10 mg via ORAL
  Filled 2013-04-06 (×6): qty 1

## 2013-04-06 MED ORDER — CALCIUM CARBONATE 600 MG PO TABS
600.0000 mg | ORAL_TABLET | Freq: Two times a day (BID) | ORAL | Status: DC
  Filled 2013-04-06: qty 1

## 2013-04-06 MED ORDER — ALUM & MAG HYDROXIDE-SIMETH 200-200-20 MG/5ML PO SUSP: 30.0000 mL | Freq: Four times a day (QID) | ORAL | Status: DC | PRN

## 2013-04-06 MED ORDER — ONDANSETRON HCL 4 MG PO TABS: 4.0000 mg | ORAL_TABLET | Freq: Four times a day (QID) | ORAL | Status: DC | PRN

## 2013-04-06 NOTE — Progress Notes (Signed)
CRITICAL VALUE ALERT  Critical value received: MRSA screen positive  Date of notification: 04/06/2013   Time of notification: 2221  Critical value read back: yes  Nurse who received alert:  Birdena Crandall RN  MD notified (1st page):    Time of first page:  2250  MD notified (2nd page):  Time of second page:  Responding MD:   Time MD responded:  2250

## 2013-04-06 NOTE — Telephone Encounter (Signed)
I called and left message with Ms Tawanna Cooler. We could accept in transfer if this is initiated by her MD's at [facility]. I asked her to keep me updated on her status.

## 2013-04-06 NOTE — H&P (Signed)
[facility] History and Physical  Beverly Hart ZOX:[id number] DOB: 02-08-1941 DOA: 04/06/2013  Referring physician: [facility] PCP: [name], MD  Specialists: Dr. Inis Sizer  Chief Complaint: Respiratory failure  HPI: Beverly Hart is a 72 y.o. female  With a pmh significant for hypertension, PVD, documented COPD, and documented diastolic heart failure who presented to [facility] on 04/02/13 with complaints of SOB x 1 week associated with shaking and chills. The patient was noted to initially have O2 sats in the low 80's on RA with CT findings suggestive of multifocal pneumonia vs edema. The patient was started on Rocephin and azithromycin. The patient was ultimately transitioned to Levaquin and the patient was felt to be in acute heart failure and continued on aggressive diuresis with q12 IV lasix. The patient was subsequently transferred to [facility] for further eval.  Review of Systems:  Sob, no cp, headache, remainder of 10 point ROS reviewed and are negative  Past Medical History  Diagnosis Date  . Acute viral bronchitis   . Anemia   . Community acquired pneumonia   . Hypertension   . Myalgia and myositis   . Osteoarthritis   . Peripheral vascular disease     noted on carotid ultrasound  . SOB (shortness of breath)   . Solitary thyroid nodule     hyperdense on the right lobe    Past Surgical History  Procedure Laterality Date  . Cholecystectomy  05/1999   Social History:  reports that she has never smoked. She has never used smokeless tobacco. She reports that she does not drink alcohol or use illicit drugs.  where does patient live--home, ALF, SNF? and with whom if at home?  Can patient participate in ADLs?  Allergies  Allergen Reactions  . Penicillins     Rash   . Sulfa Antibiotics     Rash     Family History  Problem Relation Age of Onset  . Emphysema Father   . Breast cancer Mother     (be sure to complete)  Prior to Admission  medications   Medication Sig Start Date End Date Taking? Authorizing Provider  aspirin 81 MG tablet Take 81 mg by mouth daily.    Historical Provider, MD  b complex vitamins tablet Take 2 tablets by mouth daily.    Historical Provider, MD  calcium carbonate (OS-CAL) 600 MG TABS Take 600 mg by mouth 2 (two) times daily with a meal.    Historical Provider, MD  cholecalciferol (VITAMIN D) 1000 UNITS tablet Take 2,000 Units by mouth daily.     Historical Provider, MD  citalopram (CELEXA) 10 MG tablet Take 10 mg by mouth daily.    Historical Provider, MD  cloNIDine (CATAPRES) 0.1 MG tablet Take 0.1 mg by mouth 2 (two) times daily.    Historical Provider, MD  furosemide (LASIX) 40 MG tablet Take 20 mg by mouth. Every Mon, Wed and Fri    Historical Provider, MD  Glucosamine-Chondroit-Vit C-Mn (GLUCOSAMINE 1500 COMPLEX PO) Take 1,500 mg by mouth.    Historical Provider, MD  ipratropium-albuterol (DUONEB) 0.5-2.5 (3) MG/3ML SOLN 1 vial in nebulizer up to four times daily as needed 10/19/11   Historical Provider, MD  metoprolol (LOPRESSOR) 50 MG tablet Take 50 mg by mouth 2 (two) times daily.    Historical Provider, MD  Multiple Vitamin (MULTIVITAMIN) capsule Take 1 capsule by mouth daily.    Historical Provider, MD  potassium chloride (K-DUR,KLOR-CON) 10 MEQ tablet 1 by mouth on M,W,F with Lasix  11/26/11   Historical Provider, MD  rosuvastatin (CRESTOR) 10 MG tablet Take 10 mg by mouth daily.    Historical Provider, MD   Physical Exam: There were no vitals filed for this visit.   General:  Awake, in venturi mask in place, in mild resp distress  Eyes: PERRL B  ENT: membranes moist, trachea midline  Neck: trachea midline, neck supple  Cardiovascular: regular, s1, s2  Respiratory: mild resp distress, no wheezing, trace bibasilar crackles  Abdomen: soft, nondistended  Skin: normal skin turgor, no abnormal skin lesions seen  Musculoskeletal: perfused distally, anterior shin tenderness B on  exam  Psychiatric: mood and affect normal, no auditory/visual hallucinations  Neurologic: cn2-12grossly intact, strength and sensation intact  Labs on Admission:  Basic Metabolic Panel: No results found for this basename: NA, K, CL, CO2, GLUCOSE, BUN, CREATININE, CALCIUM, MG, PHOS,  in the last 168 hours Liver Function Tests: No results found for this basename: AST, ALT, ALKPHOS, BILITOT, PROT, ALBUMIN,  in the last 168 hours No results found for this basename: LIPASE, AMYLASE,  in the last 168 hours No results found for this basename: AMMONIA,  in the last 168 hours CBC: No results found for this basename: WBC, NEUTROABS, HGB, HCT, MCV, PLT,  in the last 168 hours Cardiac Enzymes: No results found for this basename: CKTOTAL, CKMB, CKMBINDEX, TROPONINI,  in the last 168 hours  BNP (last 3 results) No results found for this basename: PROBNP,  in the last 8760 hours CBG: No results found for this basename: GLUCAP,  in the last 168 hours  Radiological Exams on Admission: No results found.   Assessment/Plan Principal Problem:   Pneumonia Active Problems:   HTN (hypertension)   Diastolic CHF   Acute respiratory failure with hypoxia   Respiratory Failure: - Multifocal pneumonia vs heart failure - For now, will empirically cover with broad abx to cover CAP - Will continue IV lasix started at [facility], as pt has noted improvement with diuresis - EF reportedly normal with value of around over 50% - Will cont O2 as needed. - Normally follows Dr. Delton Coombes - will consult Pulmonary Medcine  Diastolic CHF: - Continue IV lasix as per above - follow i/o closely - Monitor daily weight and electrolytes  HTN: - BP stable - Cont monitor closely  DVT prophylaxis: - Lovenox sub Q  Headache - CT unremarkable - No improvement with narcotics - Consider toradol PRN  LE pain: - Mainly anterior B LE pain - Will check LE dopplers to r/o DVT  Code Status: Full - confirmed through  daughter (must indicate code status--if unknown or must be presumed, indicate so) Family Communication: Pt and daughter at bedside  (indicate person spoken with, if applicable, with phone number if by telephone) Disposition Plan: Pending (indicate anticipated LOS)  Time spent:  CHIU, Scheryl Marten [facility] Pager [phone]  If 7PM-7AM, please contact night-coverage [email or web address] Password [facility] 04/06/2013, 5:41 PM

## 2013-04-06 NOTE — Telephone Encounter (Signed)
Spoke with the pt's daughter (works for [employer]) She reports pt currently admitted to [facility] with acute respiratory failure, PNA, and chemical induced lung dz She states not at all confident in Dr Terrilee Croak ability to care for her mother, and that since admitted she is progressively worsening  She is tearful when speaking to me, and asks for RB to have her transferred to [facility]  I advised unsure if this would be the best option considering her current status, but will forward to RB for his recs  Please advise thanks!

## 2013-04-06 NOTE — Progress Notes (Addendum)
ANTIBIOTIC CONSULT NOTE - INITIAL  Pharmacy Consult for Vancomycin / Levaquin Indication: pneumonia  Allergies  Allergen Reactions  . Penicillins     Rash   . Sulfa Antibiotics     Rash    Medical History: Past Medical History  Diagnosis Date  . Acute viral bronchitis   . Anemia   . Community acquired pneumonia   . Hypertension   . Myalgia and myositis   . Osteoarthritis   . Peripheral vascular disease     noted on carotid ultrasound  . SOB (shortness of breath)   . Solitary thyroid nodule     hyperdense on the right lobe    Assessment: Beverly Hart is a 72 year old female who transferred from [facility] with complaints of SOB, weakness, and chills x 1 week.  Started on antibiotics (Rocephin, Zithromax -->Levaquin) without improvement.  Patient felt to be in acute heart failure and transferred to [facility] for further evaluation.  Scr at [place] = 0.60  Now on Levaquin / Vancomycin  Goal of Therapy:  Vancomycin trough level 15-20 mcg/ml Appropriate Levaquin dosing  Plan:  1) Vancomycin 750 mg iv Q 12 hours 2) Levaquin 750 mg iv Q 24 hours 2) Follow up Scr, cultures, progress  Thank you. Okey Regal, PharmD [phone]  04/06/2013,6:38 PM

## 2013-04-06 NOTE — Consult Note (Signed)
PULMONARY  / CRITICAL CARE MEDICINE  Name: Beverly Hart MRN: [id number] DOB: November 18, 1940    ADMISSION DATE:  04/06/2013 CONSULTATION DATE:  04/06/2013  REFERRING MD :  [facility] PRIMARY SERVICE:  TRH  CHIEF COMPLAINT:  Dyspnea  BRIEF PATIENT DESCRIPTION: 72 yo with history of diastolic CHF admitted to [facility] 7/20 with dyspnea at rest aggravated by exertion and chills x 1 week.  Denied cough, sputum production, hemoptysis, chest pain, orthopnea and paroxysmal nocturnal dyspnea. Reports some symmetric bilateral pedal edema, but that was thought to be secondary to Amlodipine and improved after it was d/c'd.  Initial imaging demonstrated multifocal pneumonia vs edema.  Treated with abx for CAP and diuretics.  Transferred to [facility] per family request as appeared not to improve and had increased oxygen requirements.  SIGNIFICANT EVENTS / STUDIES:   LINES / TUBES: PICC preadmission >>>  CULTURES:  ANTIBIOTICS: Levaquin preadmission >>> Vancomycin 7/24 >>> Ceftriaxone 7/24 >>>  HISTORY OF PRESENT ILLNESS:  72 yo with history of diastolic CHF admitted to [facility] 7/20 with dyspnea at rest aggravated by exertion and chills x 1 week.  Denied cough, sputum production, hemoptysis, chest pain, orthopnea and paroxysmal nocturnal dyspnea. Reports some symmetric bilateral pedal edema, but that was thought to be secondary to Amlodipine and improved after it was d/c'd.  Initial imaging demonstrated multifocal pneumonia vs edema.  Treated with abx for CAP and diuretics.  Transferred to [facility] per family request as appeared not to improve and had increased oxygen requirements.  PAST MEDICAL HISTORY :  Past Medical History  Diagnosis Date  . Acute viral bronchitis   . Anemia   . Community acquired pneumonia   . Hypertension   . Myalgia and myositis   . Osteoarthritis   . Peripheral vascular disease     noted on carotid ultrasound  . SOB (shortness of breath)   . Solitary thyroid nodule    hyperdense on the right lobe    Past Surgical History  Procedure Laterality Date  . Cholecystectomy  05/1999   Prior to Admission medications   Medication Sig Start Date End Date Taking? Authorizing Provider  b complex vitamins tablet Take 2 tablets by mouth daily.   Yes Historical Provider, MD  calcium carbonate (OS-CAL) 600 MG TABS Take 600 mg by mouth daily.   Yes Historical Provider, MD  citalopram (CELEXA) 10 MG tablet Take 10 mg by mouth daily.   Yes Historical Provider, MD  cloNIDine (CATAPRES) 0.1 MG tablet Take 0.1 mg by mouth 2 (two) times daily.   Yes Historical Provider, MD  fish oil-omega-3 fatty acids 1000 MG capsule Take 1 g by mouth daily.   Yes Historical Provider, MD  furosemide (LASIX) 40 MG tablet Take 20 mg by mouth. Every Mon, Wed and Fri   Yes Historical Provider, MD  Glucosamine-Chondroit-Vit C-Mn (GLUCOSAMINE 1500 COMPLEX) CAPS Take 1 capsule by mouth daily.   Yes Historical Provider, MD  metoprolol (LOPRESSOR) 50 MG tablet Take 50 mg by mouth 2 (two) times daily.   Yes Historical Provider, MD  Multiple Vitamin (MULTIVITAMIN WITH MINERALS) TABS Take 1 tablet by mouth daily.   Yes Historical Provider, MD  potassium chloride (K-DUR,KLOR-CON) 10 MEQ tablet 1 by mouth on M,W,F with Lasix 11/26/11  Yes Historical Provider, MD   Allergies  Allergen Reactions  . Penicillins     Rash   . Sulfa Antibiotics     Rash   . Azithromycin Hives and Rash    FAMILY HISTORY:  Family History  Problem Relation Age of Onset  . Emphysema Father   . Breast cancer Mother    SOCIAL HISTORY:  reports that she has never smoked. She has never used smokeless tobacco. She reports that she does not drink alcohol or use illicit drugs.  REVIEW OF SYSTEMS:   Constitutional: Negative for fever, weight loss, malaise/fatigue and diaphoresis. Positive for chills. HENT: Negative for hearing loss, ear pain, nosebleeds, congestion, sore throat, neck pain, tinnitus and ear discharge.   Eyes:  Negative for blurred vision, double vision, photophobia, pain, discharge and redness.  Respiratory: Negative for cough, hemoptysis, sputum production, heezing and stridor.  Positive for shortness of breath. Cardiovascular: Negative for chest pain, palpitations, orthopnea, claudication, leg swelling and PND.  Gastrointestinal: Negative for heartburn, nausea, vomiting, abdominal pain, diarrhea, constipation, blood in stool and melena.  Genitourinary: Negative for dysuria, urgency, frequency, hematuria and flank pain.  Musculoskeletal: Negative for myalgias, back pain, joint pain and falls.  Skin: Negative for itching and rash.  Neurological: Negative for dizziness, tingling, tremors, sensory change, speech change, focal weakness, seizures, loss of consciousness, weakness. Positive for headache. Endo/Heme/Allergies: Negative for environmental allergies and polydipsia. Does not bruise/bleed easily.  SUBJECTIVE:   VITAL SIGNS: Temp:  [100.1 F (37.8 C)] 100.1 F (37.8 C) (07/24 1800) Pulse Rate:  [93-101] 94 (07/24 1900) Resp:  [22-26] 25 (07/24 1900) BP: (131-142)/(56-63) 142/62 mmHg (07/24 1900) SpO2:  [97 %-100 %] 100 % (07/24 1900) FiO2 (%):  [100 %] 100 % (07/24 1800) Weight:  [68.5 kg (151 lb 0.2 oz)] 68.5 kg (151 lb 0.2 oz) (07/24 1830)  PHYSICAL EXAMINATION: General:  No acute distress, resting comfortable. Neuro:  Awake, alert, cooperative HEENT:  PERRL, NCAT Neck:  Soft, no JVD Cardiovascular:  Regular, no murmurs Lungs:  Bilateral diminished breath sounds, no w/r/r Abdomen:  Soft, non tender, bowel sounds present Musculoskeletal:  Moves all extremities, trace edema Skin:  Intact  No results found for this basename: NA, K, CL, CO2, BUN, CREATININE, GLUCOSE,  in the last 168 hours  Recent Labs Lab 04/06/13 1912  HGB 12.3  HCT 37.1  WBC 11.0*  PLT 305   No results found.  [place] pulmonary imaging reviewed >>> progressive bilateral pulmonary infiltrates, small left  effusion.  ASSESSMENT / PLAN:  Multifocal pneumonia (CAP) Less likely diastolic CHF exacerbation Small pleural effusion, likely nonconsequential Hypoxemic respiratory failure  -->  Supplemental oxygen PRN -->  Agree with bronchodilators PRN, would d/c if significant tachycardia as should be avoided given diastolic dysfunction -->  Agree with antibiotic choice - Levaquin (atypicals) and Vancomycin (MRSA); Ceftriaxone is added to broaden coverage  -->  Check BNP, PCT, Strep / Legionella urine Ag (ordered) -->  Agree with low dose diuretics for now -->  Will follow  Lonia Farber, MD Pulmonary and Critical Care Medicine [facility] Pager: [phone]  04/06/2013, 8:10 PM

## 2013-04-07 ENCOUNTER — Telehealth: Payer: Self-pay | Admitting: Emergency Medicine

## 2013-04-07 DIAGNOSIS — J189 Pneumonia, unspecified organism: Secondary | ICD-10-CM

## 2013-04-07 DIAGNOSIS — M7989 Other specified soft tissue disorders: Secondary | ICD-10-CM

## 2013-04-07 DIAGNOSIS — N289 Disorder of kidney and ureter, unspecified: Secondary | ICD-10-CM | POA: Diagnosis present

## 2013-04-07 DIAGNOSIS — R0609 Other forms of dyspnea: Secondary | ICD-10-CM

## 2013-04-07 DIAGNOSIS — M79609 Pain in unspecified limb: Secondary | ICD-10-CM

## 2013-04-07 LAB — CBC
HCT: 32.3 % — ABNORMAL LOW (ref 36.0–46.0)
Hemoglobin: 10.5 g/dL — ABNORMAL LOW (ref 12.0–15.0)
MCV: 90 fL (ref 78.0–100.0)
RBC: 3.59 MIL/uL — ABNORMAL LOW (ref 3.87–5.11)
WBC: 9.1 10*3/uL (ref 4.0–10.5)

## 2013-04-07 LAB — COMPREHENSIVE METABOLIC PANEL
Albumin: 2.2 g/dL — ABNORMAL LOW (ref 3.5–5.2)
BUN: 24 mg/dL — ABNORMAL HIGH (ref 6–23)
Creatinine, Ser: 0.88 mg/dL (ref 0.50–1.10)
Potassium: 3.5 mEq/L (ref 3.5–5.1)
Total Protein: 6.1 g/dL (ref 6.0–8.3)

## 2013-04-07 LAB — STREP PNEUMONIAE URINARY ANTIGEN: Strep Pneumo Urinary Antigen: NEGATIVE

## 2013-04-07 NOTE — Progress Notes (Signed)
VASCULAR LAB PRELIMINARY  PRELIMINARY  PRELIMINARY  PRELIMINARY  Bilateral lower extremity venous duplex  completed.    Preliminary report:  Bilateral:  No evidence of DVT, superficial thrombosis, or Baker's Cyst.    Jessice Madill, RVT 04/07/2013, 12:41 PM

## 2013-04-07 NOTE — Progress Notes (Signed)
PULMONARY  / CRITICAL CARE MEDICINE  Name: Beverly Hart MRN: [id number] DOB: 1941-02-27    ADMISSION DATE:  04/06/2013 CONSULTATION DATE:  04/06/2013  REFERRING MD :  [facility] PRIMARY SERVICE:  TRH  CHIEF COMPLAINT:  Dyspnea  BRIEF PATIENT DESCRIPTION: 73 yo with history of diastolic CHF admitted to [facility] 7/20 with dyspnea at rest aggravated by exertion and chills x 1 week.  Denied cough, sputum production, hemoptysis, chest pain, orthopnea and paroxysmal nocturnal dyspnea. Reports some symmetric bilateral pedal edema, but that was thought to be secondary to Amlodipine and improved after it was d/c'd.  Initial imaging demonstrated multifocal pneumonia vs edema.  Treated with abx for CAP and diuretics.  Transferred to [facility] per family request as appeared not to improve and had increased oxygen requirements.  SIGNIFICANT EVENTS / STUDIES:   LINES / TUBES: PICC preadmission >>>  CULTURES:  ANTIBIOTICS: Levaquin preadmission >>> Vancomycin 7/24 >>> Ceftriaxone 7/24 >>>  SUBJECTIVE:   VITAL SIGNS: Temp:  [98 F (36.7 C)-100.1 F (37.8 C)] 98.4 F (36.9 C) (07/25 0741) Pulse Rate:  [63-101] 80 (07/25 1000) Resp:  [16-26] 20 (07/25 1000) BP: (94-142)/(44-70) 109/53 mmHg (07/25 1000) SpO2:  [91 %-100 %] 94 % (07/25 1000) FiO2 (%):  [100 %] 100 % (07/24 2021) Weight:  [68.3 kg (150 lb 9.2 oz)-68.5 kg (151 lb 0.2 oz)] 68.3 kg (150 lb 9.2 oz) (07/25 0500)  PHYSICAL EXAMINATION: General:  No acute distress, resting comfortable. Neuro:  Awake, alert, cooperative HEENT:  PERRL, NCAT Neck:  Soft, no JVD Cardiovascular:  Regular, no murmurs Lungs:  Bilateral diminished breath sounds, no w/r/r Abdomen:  Soft, non tender, bowel sounds present Musculoskeletal:  Moves all extremities, trace edema Skin:  Intact  Recent Labs Lab 04/06/13 1912 04/07/13 0500  NA  --  137  K  --  3.5  CL  --  97  CO2  --  34*  BUN  --  24*  CREATININE 0.68 0.88  GLUCOSE  --  116*   Recent  Labs Lab 04/06/13 1912 04/07/13 0500  HGB 12.3 10.5*  HCT 37.1 32.3*  WBC 11.0* 9.1  PLT 305 244   No results found.  [place] pulmonary imaging reviewed >>> progressive bilateral pulmonary infiltrates, small left effusion.  ASSESSMENT / PLAN:  Multifocal pneumonia (CAP) Less likely diastolic CHF exacerbation Small pleural effusion, likely nonconsequential Hypoxemic respiratory failure  -->  Supplemental oxygen PRN -->  Agree with bronchodilators PRN, would d/c if significant tachycardia as should be avoided given diastolic dysfunction -->  Continue rocephin, levofloxacin and vancomycin. -->  F/U on culture and tailor abx accordingly. -->  BNP (294), PCT (<.10), Strep / Legionella urine Ag (negative) -->  Agree with low dose diuretics for now -->  Will follow  Koren Bound, MD Pulmonary and Critical Care Medicine [facility] Pager: [phone]  04/07/2013, 10:15 AM

## 2013-04-07 NOTE — Telephone Encounter (Signed)
I will forward this message to RB so that he will be aware of this.

## 2013-04-07 NOTE — Progress Notes (Addendum)
[facility] Progress Note [place] TEAM 1 - Stepdown/ICU TEAM   Beverly Hart HYQ:[id number] DOB: Oct 11, 1940 DOA: 04/06/2013 PCP: [name], MD  Brief narrative: 72 y.o. female with a pmh significant for hypertension, PVD, documented COPD, and documented diastolic heart failure. Presented to [facility] on 04/02/13 with complaints of SOB x 1 week associated with shaking and chills. The patient was noted to initially have O2 sats in the low 80's on RA with CT findings suggestive of multifocal pneumonia vs edema. The patient was started on Rocephin and azithromycin. The patient was ultimately transitioned to Levaquin and the patient was felt to be in acute diastolic heart failure and aggressive diuresis was initiated with q12 IV lasix. The patient was subsequently transferred to [facility] for further eval.  Assessment/Plan:    Acute respiratory failure with hypoxia / Bilateral multifical pneumonia -stable -repeat CXR in AM -cont empiric anbx's and supportive care      Diastolic CHF -adequately diuresed and BUN climbing so will dc Lasix -not convinced pt with significant HF    Pleural effusion -tiny and inconsequential    HTN (hypertension) -BP soft -cont Catapres and Lopressor for now but watch for hypotension -partly influenced by mild volume depletion    Acute renal insufficiency -baseline Scr unknown -Lasix stopped - follow lytes   DVT prophylaxis: Lovenox Code Status: Full code Family Communication: Patient  Disposition Plan: Remain in step down Isolation: Contact isolation for MRSA PCR positive status   Consultants: Pulmonary medicine  Procedures: None  Antibiotics: Rocephin 7/20 >>> Vancomycin 7/24 >>> Azithromycin 7/20 >>> 7/23 Levaquin 7/24 >>  HPI/Subjective: Patient alert - denies shortness of breath, chest pain or orthopnea.   Objective: Blood pressure 109/53, pulse 80, temperature 98.4 F (36.9 C), temperature source Oral,  resp. rate 20, height 5\' 2"  (1.575 m), weight 68.3 kg (150 lb 9.2 oz), SpO2 94.00%.  Intake/Output Summary (Last 24 hours) at 04/07/13 1336 Last data filed at 04/07/13 0900  Gross per 24 hour  Intake    810 ml  Output    550 ml  Net    260 ml     Exam: General: No acute respiratory distress at rest  Lungs: Coarse to auscultation bilaterally without wheezes or crackles slightly diminished in the bases,  6 L Cardiovascular: Regular rate and rhythm without murmur gallop or rub normal S1 and S2, no peripheral edema or JVD Abdomen: Nontender, nondistended, soft, bowel sounds positive, no rebound, no ascites, no appreciable mass Musculoskeletal: No significant cyanosis, clubbing of bilateral lower extremities Neurological: Alert and oriented x 3, moves all extremities x 4 without focal neurological deficits, CN 2-12 intact  Scheduled Meds: Scheduled Meds: . aspirin  81 mg Oral Daily  . atorvastatin  10 mg Oral q1800  . calcium-vitamin D  1 tablet Oral BID WC  . cefTRIAXone (ROCEPHIN)  IV  2 g Intravenous Q24H  . Chlorhexidine Gluconate Cloth  6 each Topical Q0600  . cholecalciferol  2,000 Units Oral Daily  . citalopram  10 mg Oral Daily  . cloNIDine  0.1 mg Oral BID  . docusate sodium  100 mg Oral BID  . enoxaparin (LOVENOX) injection  40 mg Subcutaneous Q24H  . levofloxacin (LEVAQUIN) IV  750 mg Intravenous Q24H  . metoprolol  50 mg Oral BID  . mupirocin ointment  1 application Nasal BID  . potassium chloride  10 mEq Oral Daily  . sodium chloride  3 mL Intravenous Q12H  . vancomycin  750 mg Intravenous Q12H  Data Reviewed: Basic Metabolic Panel:  Recent Labs Lab 04/06/13 1912 04/07/13 0500  NA  --  137  K  --  3.5  CL  --  97  CO2  --  34*  GLUCOSE  --  116*  BUN  --  24*  CREATININE 0.68 0.88  CALCIUM  --  9.2   Liver Function Tests:  Recent Labs Lab 04/07/13 0500  AST 24  ALT 8  ALKPHOS 78  BILITOT 0.3  PROT 6.1  ALBUMIN 2.2*   CBC:  Recent  Labs Lab 04/06/13 1912 04/07/13 0500  WBC 11.0* 9.1  HGB 12.3 10.5*  HCT 37.1 32.3*  MCV 89.4 90.0  PLT 305 244   BNP (last 3 results)  Recent Labs  04/06/13 1912  PROBNP 294.2*     Recent Results (from the past 240 hour(s))  MRSA PCR SCREENING     Status: Abnormal   Collection Time    04/06/13  6:37 PM      Result Value Range Status   MRSA by PCR POSITIVE (*) NEGATIVE Final   Comment:            The GeneXpert MRSA Assay (FDA     approved for NASAL specimens     only), is one component of a     comprehensive MRSA colonization     surveillance program. It is not     intended to diagnose MRSA     infection nor to guide or     monitor treatment for     MRSA infections.     RESULT CALLED TO, READ BACK BY AND VERIFIED WITH:     A PETTIFORD RN 2220 04/06/13 A BROWNING     Studies:  Recent x-ray studies have been reviewed in detail by the Attending Physician    Junious Silk, ANP [facility] Office  [phone] Pager [phone]  **If unable to reach the above provider after paging please contact the Flow Manager @ [phone]  On-Call/Text Page:      [email or web address]      password [email or web address]  If 7PM-7AM, please contact night-coverage [email or web address] Password [email or web address] 04/07/2013, 1:36 PM   LOS: 1 day   I have personally examined this patient and reviewed the entire database. I have reviewed the above note, made any necessary editorial changes, and agree with its content.  Lonia Blood, MD [facility]

## 2013-04-08 ENCOUNTER — Inpatient Hospital Stay (HOSPITAL_COMMUNITY): Payer: Commercial Managed Care - PPO

## 2013-04-08 LAB — BASIC METABOLIC PANEL
BUN: 27 mg/dL — ABNORMAL HIGH (ref 6–23)
Chloride: 94 mEq/L — ABNORMAL LOW (ref 96–112)
GFR calc Af Amer: 77 mL/min — ABNORMAL LOW (ref >90)
Potassium: 3.6 mEq/L (ref 3.5–5.1)
Sodium: 136 mEq/L (ref 135–145)

## 2013-04-08 LAB — CBC
HCT: 31 % — ABNORMAL LOW (ref 36.0–46.0)
Hemoglobin: 10.1 g/dL — ABNORMAL LOW (ref 12.0–15.0)
MCHC: 32.6 g/dL (ref 30.0–36.0)
RBC: 3.45 MIL/uL — ABNORMAL LOW (ref 3.87–5.11)
WBC: 7.8 10*3/uL (ref 4.0–10.5)

## 2013-04-08 LAB — LEGIONELLA ANTIGEN, URINE: Legionella Antigen, Urine: NEGATIVE

## 2013-04-08 MED ORDER — CLONAZEPAM 0.5 MG PO TABS
0.2500 mg | ORAL_TABLET | Freq: Three times a day (TID) | ORAL | Status: DC | PRN
  Administered 2013-04-09: 0.5 mg via ORAL
  Filled 2013-04-08: qty 1

## 2013-04-08 NOTE — Progress Notes (Signed)
[facility] Progress Note [place] TEAM 1 - Stepdown/ICU TEAM   Beverly Hart ZOX:[id number] DOB: 1941/05/28 DOA: 04/06/2013 PCP: [name], MD  Brief narrative: 72 y.o. female with a pmh significant for hypertension, PVD, COPD, and ?diastolic heart failure. Presented to [facility] on 04/02/13 with complaints of SOB x 1 week associated with shaking and chills. The patient was noted to initially have O2 sats in the low 80's on RA with CT findings suggestive of multifocal pneumonia vs edema. The patient was started on Rocephin and Azithromycin. The patient was ultimately transitioned to Levaquin and was felt to be in acute diastolic heart failure.  Aggressive diuresis was initiated with q12 IV lasix. The patient was subsequently transferred to [facility] for further eval.  Assessment/Plan:  Acute respiratory failure with hypoxia / Bilateral multifical pneumonia empiric anbx's and supportive care - CXR w/o significant change today     Diastolic CHF? Well compensated to slightly dry at this time - TTE from Jan 2013 appears to be entirely normal - clinical exam is not c/w CHF  Pleural effusions tiny and clinically inconsequential  Reported hx of COPD Does not appear to be on any chronic med tx - no wheezing on exam   HTN BP stable at this time   MRSA screen +  DVT prophylaxis: Lovenox Code Status: FULL Family Communication: no family present at time of exam  Disposition Plan: SDU  Consultants: Pulmonary medicine  Procedures: 7/25 - B LE venous dopplers negative for DVT  Antibiotics: Rocephin 7/20 >>> Vancomycin 7/24 >>> Levaquin 7/24 >> Azithromycin 7/20 >>> 7/23   HPI/Subjective: Patient alert - denies shortness of breath, chest pain or orthopnea.  Says she feels much better today.  Still requiring venturi to keep sats >90%.   Objective: Blood pressure 136/107, pulse 48, temperature 97.5 F (36.4 C), temperature source Axillary, resp. rate 23,  height 5\' 2"  (1.575 m), weight 68 kg (149 lb 14.6 oz), SpO2 93.00%.  Intake/Output Summary (Last 24 hours) at 04/08/13 1552 Last data filed at 04/08/13 1300  Gross per 24 hour  Intake    780 ml  Output    400 ml  Net    380 ml     Exam: General: No acute respiratory distress at rest  Lungs: Coarse to auscultation bilaterally without wheezes or crackles slightly diminished in the bases Cardiovascular: Regular rate and rhythm without murmur gallop or rub normal S1 and S2, no peripheral edema or JVD Abdomen: Nontender, nondistended, soft, bowel sounds positive, no rebound, no ascites, no appreciable mass Musculoskeletal: No significant cyanosis, clubbing of bilateral lower extremities   Scheduled Meds: Scheduled Meds: . aspirin  81 mg Oral Daily  . atorvastatin  10 mg Oral q1800  . calcium-vitamin D  1 tablet Oral BID WC  . cefTRIAXone (ROCEPHIN)  IV  2 g Intravenous Q24H  . Chlorhexidine Gluconate Cloth  6 each Topical Q0600  . cholecalciferol  2,000 Units Oral Daily  . citalopram  10 mg Oral Daily  . cloNIDine  0.1 mg Oral BID  . docusate sodium  100 mg Oral BID  . enoxaparin (LOVENOX) injection  40 mg Subcutaneous Q24H  . levofloxacin (LEVAQUIN) IV  750 mg Intravenous Q24H  . metoprolol  50 mg Oral BID  . mupirocin ointment  1 application Nasal BID  . potassium chloride  10 mEq Oral Daily  . sodium chloride  3 mL Intravenous Q12H  . vancomycin  750 mg Intravenous Q12H    Data Reviewed: Basic  Metabolic Panel:  Recent Labs Lab 04/06/13 1912 04/07/13 0500 04/08/13 0415  NA  --  137 136  K  --  3.5 3.6  CL  --  97 94*  CO2  --  34* 35*  GLUCOSE  --  116* 111*  BUN  --  24* 27*  CREATININE 0.68 0.88 0.85  CALCIUM  --  9.2 9.8   Liver Function Tests:  Recent Labs Lab 04/07/13 0500  AST 24  ALT 8  ALKPHOS 78  BILITOT 0.3  PROT 6.1  ALBUMIN 2.2*   CBC:  Recent Labs Lab 04/06/13 1912 04/07/13 0500 04/08/13 0415  WBC 11.0* 9.1 7.8  HGB 12.3 10.5*  10.1*  HCT 37.1 32.3* 31.0*  MCV 89.4 90.0 89.9  PLT 305 244 235   BNP (last 3 results)  Recent Labs  04/06/13 1912  PROBNP 294.2*     Recent Results (from the past 240 hour(s))  MRSA PCR SCREENING     Status: Abnormal   Collection Time    04/06/13  6:37 PM      Result Value Range Status   MRSA by PCR POSITIVE (*) NEGATIVE Final   Comment:            The GeneXpert MRSA Assay (FDA     approved for NASAL specimens     only), is one component of a     comprehensive MRSA colonization     surveillance program. It is not     intended to diagnose MRSA     infection nor to guide or     monitor treatment for     MRSA infections.     RESULT CALLED TO, READ BACK BY AND VERIFIED WITH:     A PETTIFORD RN 2220 04/06/13 A BROWNING     Studies:  Recent x-ray studies have been reviewed in detail by the Attending Physician  Lonia Blood, MD [facility] Office  [phone] Pager [phone]  On-Call/Text Page:      [email or web address]      password [email or web address]  If 7PM-7AM, please contact night-coverage [email or web address] Password [email or web address] 04/08/2013, 3:52 PM   LOS: 2 days

## 2013-04-09 LAB — CBC
HCT: 31.6 % — ABNORMAL LOW (ref 36.0–46.0)
Hemoglobin: 10.6 g/dL — ABNORMAL LOW (ref 12.0–15.0)
MCH: 29.7 pg (ref 26.0–34.0)
MCHC: 33.5 g/dL (ref 30.0–36.0)
RBC: 3.57 MIL/uL — ABNORMAL LOW (ref 3.87–5.11)

## 2013-04-09 LAB — BASIC METABOLIC PANEL
BUN: 18 mg/dL (ref 6–23)
CO2: 31 mEq/L (ref 19–32)
Chloride: 99 mEq/L (ref 96–112)
Glucose, Bld: 102 mg/dL — ABNORMAL HIGH (ref 70–99)
Potassium: 3.6 mEq/L (ref 3.5–5.1)
Sodium: 138 mEq/L (ref 135–145)

## 2013-04-09 NOTE — Progress Notes (Signed)
[facility] Progress Note [name] TEAM 1 - Stepdown/ICU TEAM   Beverly Hart ZOX:[id number] DOB: 1941-06-25 DOA: 04/06/2013 PCP: [name], MD  Brief narrative: 72 y.o. female with a pmh significant for hypertension, PVD, COPD, and ?diastolic heart failure. Presented to [facility] on 04/02/13 with complaints of SOB x 1 week associated with shaking and chills. The patient was noted to initially have O2 sats in the low 80's on RA with CT findings suggestive of multifocal pneumonia vs edema. The patient was started on Rocephin and Azithromycin. The patient was ultimately transitioned to Levaquin and was felt to be in acute diastolic heart failure.  Aggressive diuresis was initiated with q12 IV lasix. The patient was subsequently transferred to [facility] for further eval.  Assessment/Plan:  Acute respiratory failure with hypoxia / Bilateral multifical pneumonia empiric anbx's and supportive care - CXR w/o significant change today - wean O2 as able (still on venturi at this time)    Diastolic CHF? Well compensated at this time - TTE from Jan 2013 appears to be entirely normal - clinical exam is not c/w CHF  Thrombocytopenia ? HIT - stopped lovenox - HIT panel drawn - SCDs for now   Pleural effusions tiny and clinically inconsequential  Reported hx of COPD Does not appear to be on any chronic med tx - no wheezing on exam   HTN BP stable at this time   MRSA screen +  DVT prophylaxis: SCDs Code Status: FULL Family Communication: discussed care w/ son and bedside   Disposition Plan: SDU  Consultants: Pulmonary medicine  Procedures: 7/25 - B LE venous dopplers negative for DVT  Antibiotics: Rocephin 7/20 >>> Vancomycin 7/24 >>> Levaquin 7/24 >> Azithromycin 7/20 >>> 7/23   HPI/Subjective: Patient alert - denies shortness of breath, chest pain or orthopnea.  Sats on venturi at 40% > 96%.  Objective: Blood pressure 111/47, pulse 53, temperature 98 F  (36.7 C), temperature source Oral, resp. rate 18, height 5\' 2"  (1.575 m), weight 69 kg (152 lb 1.9 oz), SpO2 92.00%.  Intake/Output Summary (Last 24 hours) at 04/09/13 1647 Last data filed at 04/09/13 0841  Gross per 24 hour  Intake    750 ml  Output   1150 ml  Net   -400 ml     Exam: General: No acute respiratory distress at rest  Lungs: Coarse to auscultation bilaterally without wheezes or crackles slightly diminished in the bases - BS distant  Cardiovascular: Regular rate and rhythm without murmur gallop or rub normal S1 and S2, no peripheral edema or JVD Abdomen: Nontender, nondistended, soft, bowel sounds positive, no rebound, no ascites, no appreciable mass Musculoskeletal: No significant cyanosis, clubbing of bilateral lower extremities   Scheduled Meds: Scheduled Meds: . aspirin  81 mg Oral Daily  . atorvastatin  10 mg Oral q1800  . calcium-vitamin D  1 tablet Oral BID WC  . cefTRIAXone (ROCEPHIN)  IV  2 g Intravenous Q24H  . Chlorhexidine Gluconate Cloth  6 each Topical Q0600  . cholecalciferol  2,000 Units Oral Daily  . citalopram  10 mg Oral Daily  . cloNIDine  0.1 mg Oral BID  . docusate sodium  100 mg Oral BID  . levofloxacin (LEVAQUIN) IV  750 mg Intravenous Q24H  . metoprolol  50 mg Oral BID  . mupirocin ointment  1 application Nasal BID  . potassium chloride  10 mEq Oral Daily  . sodium chloride  3 mL Intravenous Q12H  . vancomycin  750 mg Intravenous  Q12H    Data Reviewed: Basic Metabolic Panel:  Recent Labs Lab 04/06/13 1912 04/07/13 0500 04/08/13 0415 04/09/13 0636  NA  --  137 136 138  K  --  3.5 3.6 3.6  CL  --  97 94* 99  CO2  --  34* 35* 31  GLUCOSE  --  116* 111* 102*  BUN  --  24* 27* 18  CREATININE 0.68 0.88 0.85 0.83  CALCIUM  --  9.2 9.8 9.4   Liver Function Tests:  Recent Labs Lab 04/07/13 0500  AST 24  ALT 8  ALKPHOS 78  BILITOT 0.3  PROT 6.1  ALBUMIN 2.2*   CBC:  Recent Labs Lab 04/06/13 1912 04/07/13 0500  04/08/13 0415 04/09/13 0636  WBC 11.0* 9.1 7.8 5.5  HGB 12.3 10.5* 10.1* 10.6*  HCT 37.1 32.3* 31.0* 31.6*  MCV 89.4 90.0 89.9 88.5  PLT 305 244 235 101*   BNP (last 3 results)  Recent Labs  04/06/13 1912  PROBNP 294.2*     Recent Results (from the past 240 hour(s))  MRSA PCR SCREENING     Status: Abnormal   Collection Time    04/06/13  6:37 PM      Result Value Range Status   MRSA by PCR POSITIVE (*) NEGATIVE Final   Comment:            The GeneXpert MRSA Assay (FDA     approved for NASAL specimens     only), is one component of a     comprehensive MRSA colonization     surveillance program. It is not     intended to diagnose MRSA     infection nor to guide or     monitor treatment for     MRSA infections.     RESULT CALLED TO, READ BACK BY AND VERIFIED WITH:     A PETTIFORD RN 2220 04/06/13 A BROWNING     Studies:  Recent x-ray studies have been reviewed in detail by the Attending Physician  Lonia Blood, MD [facility] Office  [phone] Pager [phone]  On-Call/Text Page:      [email or web address]      password [email or web address]  If 7PM-7AM, please contact night-coverage [email or web address] Password [facility] 04/09/2013, 4:47 PM   LOS: 3 days

## 2013-04-09 NOTE — Progress Notes (Signed)
ANTIBIOTIC CONSULT NOTE - FOLLOW UP  Pharmacy Consult for Vancomycin Indication: Suspected PNA  Allergies  Allergen Reactions  . Penicillins     Rash   . Sulfa Antibiotics     Rash   . Azithromycin Hives and Rash    Patient Measurements: Height: 5\' 2"  (157.5 cm) Weight: 152 lb 1.9 oz (69 kg) IBW/kg (Calculated) : 50.1 Adjusted Body Weight:   Vital Signs: Temp: 97.6 F (36.4 C) (07/27 0428) Temp src: Oral (07/27 0428) BP: 128/50 mmHg (07/27 0803) Pulse Rate: 68 (07/27 0803) Intake/Output from previous day: 07/26 0701 - 07/27 0700 In: 1230 [P.O.:680; IV Piggyback:550] Out: 1350 [Urine:1350] Intake/Output from this shift:    Labs:  Recent Labs  04/07/13 0500 04/08/13 0415 04/09/13 0636  WBC 9.1 7.8 5.5  HGB 10.5* 10.1* 10.6*  PLT 244 235 101*  CREATININE 0.88 0.85 0.83   Estimated Creatinine Clearance: 55.8 ml/min (by C-G formula based on Cr of 0.83).  Recent Labs  04/09/13 0636  VANCOTROUGH 18.9     Microbiology: Recent Results (from the past 720 hour(s))  MRSA PCR SCREENING     Status: Abnormal   Collection Time    04/06/13  6:37 PM      Result Value Range Status   MRSA by PCR POSITIVE (*) NEGATIVE Final   Comment:            The GeneXpert MRSA Assay (FDA     approved for NASAL specimens     only), is one component of a     comprehensive MRSA colonization     surveillance program. It is not     intended to diagnose MRSA     infection nor to guide or     monitor treatment for     MRSA infections.     RESULT CALLED TO, READ BACK BY AND VERIFIED WITH:     A PETTIFORD RN 2220 04/06/13 A BROWNING    Anti-infectives   Start     Dose/Rate Route Frequency Ordered Stop   04/06/13 2030  cefTRIAXone (ROCEPHIN) 2 g in dextrose 5 % 50 mL IVPB     2 g 100 mL/hr over 30 Minutes Intravenous Every 24 hours 04/06/13 2028     04/06/13 2000  vancomycin (VANCOCIN) IVPB 750 mg/150 ml premix     750 mg 150 mL/hr over 60 Minutes Intravenous Every 12 hours  04/06/13 1846 04/13/13 1959   04/06/13 1845  levofloxacin (LEVAQUIN) IVPB 750 mg     750 mg 100 mL/hr over 90 Minutes Intravenous Every 24 hours 04/06/13 1835 04/13/13 1844      Assessment: 71yof on Vancomcyin Day 3 for suspected PNA. Patient is also receiving Levofloxacin and Ceftriaxone. Patient is afebrile and WBC have decreased to WNL. Patient renal function has remained stable during admission. Vancomycin trough checked this AM was therapeutic at 18.48mcg/ml (drawn ~1.5 hours late) - will continue current regimen and not plan to recheck Vancomycin level unless continued beyond the planned 7 day course.  Goal of Therapy:  Vancomycin trough level 15-20 mcg/ml  Plan:  1. Continue Vancomycin 750mg  IV q12h 2. Continue Levaquin 750mg  IV q24h and Ceftriaxone 2g IV q24h - consider narrowing therapy if clinically appropriate 3. Monitor renal function, clinical course and adjust as indicated  Cleon Dew [phone] 04/09/2013,8:10 AM

## 2013-04-10 DIAGNOSIS — I5031 Acute diastolic (congestive) heart failure: Secondary | ICD-10-CM

## 2013-04-10 DIAGNOSIS — N289 Disorder of kidney and ureter, unspecified: Secondary | ICD-10-CM

## 2013-04-10 LAB — STREP PNEUMONIAE URINARY ANTIGEN: Strep Pneumo Urinary Antigen: NEGATIVE

## 2013-04-10 MED ORDER — BACID PO TABS
2.0000 | ORAL_TABLET | Freq: Three times a day (TID) | ORAL | Status: DC
  Administered 2013-04-10 – 2013-04-11 (×3): 2 via ORAL
  Filled 2013-04-10 (×7): qty 2

## 2013-04-10 MED ORDER — FUROSEMIDE 10 MG/ML IJ SOLN
40.0000 mg | Freq: Two times a day (BID) | INTRAMUSCULAR | Status: DC
  Administered 2013-04-10: 40 mg via INTRAVENOUS
  Filled 2013-04-10: qty 4

## 2013-04-10 MED ORDER — POTASSIUM CHLORIDE CRYS ER 20 MEQ PO TBCR
40.0000 meq | EXTENDED_RELEASE_TABLET | Freq: Once | ORAL | Status: AC
  Administered 2013-04-10: 40 meq via ORAL
  Filled 2013-04-10: qty 2

## 2013-04-10 NOTE — Progress Notes (Signed)
[facility] Progress Note [place] TEAM 1 - Stepdown/ICU TEAM   Beverly Hart ZOX:[id number] DOB: 1940/12/07 DOA: 04/06/2013 PCP: [name], MD  Brief narrative: 72 y.o. female with a pmh significant for hypertension, PVD, COPD, and ?diastolic heart failure. Presented to [facility] on 04/02/13 with complaints of SOB x 1 week associated with shaking and chills. The patient was noted to initially have O2 sats in the low 80's on RA with CT findings suggestive of multifocal pneumonia vs edema. The patient was started on Rocephin and Azithromycin. The patient was ultimately transitioned to Levaquin and was felt to be in acute diastolic heart failure.  Aggressive diuresis was initiated with q12 IV lasix. The patient was subsequently transferred to [facility] for further eval.  Since admission, review of old records revealed a TTE from Jan 2013 which suggests no evidence of systolic or diastolic CHF.  Diuresis was stopped, due to elevation of BUN and lack of clinical evidence to support volume overload.  Empiric broad spectrum abx have been the mainstay of tx, and with such the pt has slowly but steadily improved.    Assessment/Plan:  Acute respiratory failure with hypoxia / Bilateral multifical pneumonia empiric anbx's and supportive care - wean O2 as able - Vanco/Rocephin dc'd by Pulmonary on 7/28 but will cont Levaquin - was NOT on oxygen at home - begin PT/OT     Diastolic CHF? Well compensated at this time - TTE from Jan 2013 appears to be entirely normal - clinical exam is not c/w CHF  Thrombocytopenia ? HIT - stopped lovenox - HIT panel drawn - SCDs for now - recheck CBC in AM  Pleural effusions tiny and clinically inconsequential  Reported hx of COPD Does not appear to be on any chronic med tx - no wheezing on exam   HTN BP stable at this time   MRSA screen +  Laceration right 1st finger sutured 14 days ago - wound appears to be healing well with no  complications - d/c sutures today  DVT prophylaxis: SCDs Code Status: FULL Family Communication: Patient  Disposition Plan/Expected LOS: Transfer to Floor - needs a minimum of 48 hrs additional stay as we attempt to wean from oxygen and mobilize - PT/OT eval pending and hopeful can return to home  Consultants: Pulmonary medicine  Procedures: 7/25 - B LE venous dopplers negative for DVT  Antibiotics: Rocephin 7/20 >>> 7/28 Vancomycin 7/24 >>> 7/28 Levaquin 7/24 >> Azithromycin 7/20 >>> 7/23   HPI/Subjective: Patient alert -up in chair- no complaints.  Objective: Blood pressure 124/50, pulse 61, temperature 97.8 F (36.6 C), temperature source Oral, resp. rate 20, height 5\' 2"  (1.575 m), weight 68.5 kg (151 lb 0.2 oz), SpO2 93.00%.  Intake/Output Summary (Last 24 hours) at 04/10/13 1300 Last data filed at 04/10/13 0900  Gross per 24 hour  Intake    810 ml  Output   1000 ml  Net   -190 ml    Exam: General: No acute respiratory distress at rest  Lungs: Coarse to auscultation bilaterally without wheezes - slightly diminished in the bases - BS distant -2 L Cardiovascular: Regular rate and rhythm without murmur gallop or rub normal S1 and S2, no peripheral edema or JVD Abdomen: Nontender, nondistended, soft, bowel sounds positive, no rebound, no ascites, no appreciable mass Musculoskeletal: No significant cyanosis, clubbing of bilateral lower extremities   Scheduled Meds: Scheduled Meds: . aspirin  81 mg Oral Daily  . atorvastatin  10 mg Oral q1800  .  calcium-vitamin D  1 tablet Oral BID WC  . Chlorhexidine Gluconate Cloth  6 each Topical Q0600  . cholecalciferol  2,000 Units Oral Daily  . citalopram  10 mg Oral Daily  . cloNIDine  0.1 mg Oral BID  . docusate sodium  100 mg Oral BID  . furosemide  40 mg Intravenous Q12H  . lactobacillus acidophilus  2 tablet Oral TID  . levofloxacin (LEVAQUIN) IV  750 mg Intravenous Q24H  . metoprolol  50 mg Oral BID  . mupirocin  ointment  1 application Nasal BID  . potassium chloride  40 mEq Oral Once  . sodium chloride  3 mL Intravenous Q12H    Data Reviewed: Basic Metabolic Panel:  Recent Labs Lab 04/06/13 1912 04/07/13 0500 04/08/13 0415 04/09/13 0636  NA  --  137 136 138  K  --  3.5 3.6 3.6  CL  --  97 94* 99  CO2  --  34* 35* 31  GLUCOSE  --  116* 111* 102*  BUN  --  24* 27* 18  CREATININE 0.68 0.88 0.85 0.83  CALCIUM  --  9.2 9.8 9.4   Liver Function Tests:  Recent Labs Lab 04/07/13 0500  AST 24  ALT 8  ALKPHOS 78  BILITOT 0.3  PROT 6.1  ALBUMIN 2.2*   CBC:  Recent Labs Lab 04/06/13 1912 04/07/13 0500 04/08/13 0415 04/09/13 0636  WBC 11.0* 9.1 7.8 5.5  HGB 12.3 10.5* 10.1* 10.6*  HCT 37.1 32.3* 31.0* 31.6*  MCV 89.4 90.0 89.9 88.5  PLT 305 244 235 101*   BNP (last 3 results)  Recent Labs  04/06/13 1912  PROBNP 294.2*     Recent Results (from the past 240 hour(s))  MRSA PCR SCREENING     Status: Abnormal   Collection Time    04/06/13  6:37 PM      Result Value Range Status   MRSA by PCR POSITIVE (*) NEGATIVE Final   Comment:            The GeneXpert MRSA Assay (FDA     approved for NASAL specimens     only), is one component of a     comprehensive MRSA colonization     surveillance program. It is not     intended to diagnose MRSA     infection nor to guide or     monitor treatment for     MRSA infections.     RESULT CALLED TO, READ BACK BY AND VERIFIED WITH:     A PETTIFORD RN 2220 04/06/13 A BROWNING     Studies:  Recent x-ray studies have been reviewed in detail by the Attending Physician  Junious Silk, ANP [facility] Office  [phone] Pager [phone]  On-Call/Text Page:      [email or web address]      password [email or web address]  If 7PM-7AM, please contact night-coverage [email or web address] Password [email or web address] 04/10/2013, 1:00 PM   LOS: 4 days   I have personally examined this patient and reviewed the entire database. I have reviewed the above note, made any  necessary editorial changes, and agree with its content.  Lonia Blood, MD [facility]

## 2013-04-10 NOTE — Progress Notes (Signed)
PULMONARY  / CRITICAL CARE MEDICINE  Name: Beverly Hart MRN: [id number] DOB: 09/30/1940    ADMISSION DATE:  04/06/2013 CONSULTATION DATE:  04/06/2013  REFERRING MD :  [facility] PRIMARY SERVICE:  [facility]  CHIEF COMPLAINT:  Dyspnea  BRIEF PATIENT DESCRIPTION: 72 yo with history of diastolic CHF admitted to [facility] 7/20 with dyspnea at rest aggravated by exertion and chills x 1 week. Initial imaging demonstrated multifocal pneumonia vs edema.  Treated with abx for CAP and diuretics.  Transferred to [facility] per family request as appeared not to improve and had increased oxygen requirements.  SIGNIFICANT EVENTS / STUDIES:  No distress LINES / TUBES: PICC preadmission >>>  CULTURES: 7/24 MRSA>>>pos 7/28 strep>>> 7/28 leg >>>  ANTIBIOTICS: Levaquin preadmission >>> Vancomycin 7/24 >>>7/28 Ceftriaxone 7/24 >>>7/28  SUBJECTIVE: Patient alert, sitting in chair, states she "feels better." Denies cough but states she is spitting up sputum.  VITAL SIGNS: Temp:  [97.7 F (36.5 C)-99.1 F (37.3 C)] 98.3 F (36.8 C) (07/28 0841) Pulse Rate:  [48-72] 65 (07/28 0841) Resp:  [14-26] 18 (07/28 0841) BP: (107-144)/(45-63) 130/53 mmHg (07/28 0841) SpO2:  [82 %-97 %] 93 % (07/28 0841) Weight:  [151 lb 0.2 oz (68.5 kg)] 151 lb 0.2 oz (68.5 kg) (07/28 0401)  PHYSICAL EXAMINATION: General:  71yo F, NAD, sitting in chair Neuro:  Awake, alert, cooperative Cardiovascular:  Regular, no m/r/g Lungs:  Bilateral diminished breath sounds Abdomen:  Soft, non tender, bowel sounds present Musculoskeletal:  Moves all extremities, trace edema Skin:  Intact  Recent Labs Lab 04/07/13 0500 04/08/13 0415 04/09/13 0636  NA 137 136 138  K 3.5 3.6 3.6  CL 97 94* 99  CO2 34* 35* 31  BUN 24* 27* 18  CREATININE 0.88 0.85 0.83  GLUCOSE 116* 111* 102*    Recent Labs Lab 04/07/13 0500 04/08/13 0415 04/09/13 0636  HGB 10.5* 10.1* 10.6*  HCT 32.3* 31.0* 31.6*  WBC 9.1 7.8 5.5  PLT 244 235 101*   No  results found.  CXR:  - central cath tip at junction of SVC and RA, no PTX, patchy infiltrate bil with partial clearing from L mid lung  ASSESSMENT / PLAN:  Multifocal pneumonia (CAP) Less likely diastolic CHF exacerbation Small pleural effusion, likely nonconsequential Hypoxemic respiratory failure  - Supplemental oxygen PRN -  Continue bronchodilators PRN, would d/c if significant tachycardia as should be avoided given diastolic dysfunction -  Dc rocephin and vancomycin  -continue  Levofloxacin for continued atypical coverage, bilateral process -  Sputum culture to narrow abx therapy as cxr has not improved -assess urien strep, leg ag -  Continue diuretics as has tolerated well -pcxr folow up in am -if not imporved after 24 hrs more of diuresis, then consider NON-infectious etiology work up, esr, ana, etc -no role empiric steroids at this stage, presenting illness favors infectious -bnp assessment -k supp with lasix  Leo Rod, PA-S  I have fully examined this patient and agree with above findings.    And edited in full  Mcarthur Rossetti. Tyson Alias, MD, FACP Pgr: [phone] [facility]

## 2013-04-11 ENCOUNTER — Inpatient Hospital Stay (HOSPITAL_COMMUNITY): Payer: Commercial Managed Care - PPO

## 2013-04-11 LAB — LEGIONELLA ANTIGEN, URINE

## 2013-04-11 LAB — BASIC METABOLIC PANEL
CO2: 31 mEq/L (ref 19–32)
Calcium: 9.3 mg/dL (ref 8.4–10.5)
Chloride: 99 mEq/L (ref 96–112)
Creatinine, Ser: 1.03 mg/dL (ref 0.50–1.10)
Glucose, Bld: 106 mg/dL — ABNORMAL HIGH (ref 70–99)
Sodium: 139 mEq/L (ref 135–145)

## 2013-04-11 MED ORDER — DSS 100 MG PO CAPS: 100.0000 mg | ORAL_CAPSULE | Freq: Two times a day (BID) | ORAL | Status: DC

## 2013-04-11 MED ORDER — LEVOFLOXACIN 750 MG PO TABS: 750.0000 mg | ORAL_TABLET | Freq: Every day | ORAL | Status: DC

## 2013-04-11 MED ORDER — LEVOFLOXACIN 750 MG PO TABS
750.0000 mg | ORAL_TABLET | Freq: Every day | ORAL | Status: DC
  Administered 2013-04-11: 750 mg via ORAL
  Filled 2013-04-11: qty 1

## 2013-04-11 NOTE — Progress Notes (Signed)
PULMONARY  / CRITICAL CARE MEDICINE  Name: Beverly Hart MRN: [id number] DOB: Mar 11, 1941    ADMISSION DATE:  04/06/2013 CONSULTATION DATE:  04/06/2013  REFERRING MD :  [facility] PRIMARY SERVICE:  [facility]  CHIEF COMPLAINT:  Dyspnea  BRIEF PATIENT DESCRIPTION: 72 yo with history of diastolic CHF admitted to [facility] 7/20 with dyspnea at rest aggravated by exertion and chills x 1 week. Initial imaging demonstrated multifocal pneumonia vs edema.  Treated with abx for CAP and diuretics.  Transferred to [facility] per family request as appeared not to improve and had increased oxygen requirements.  STUDIES:  CT chest [facility]) 7/20 >> diffuse, patchy b/l ASD, small hiatal hernia CT sinus [facility]) 7/20 >> ethmoid/Rt frontal sinus opacification, mild/diffuse white matter changes Doppler legs b/l 7/25 >> no DVT  LINES / TUBES: PICC preadmission >>>  CULTURES: 7/24 MRSA>>>positive 7/28 strep>>>negative 7/28 leg >>>  ANTIBIOTICS: Levaquin preadmission >>> Vancomycin 7/24 >>>7/28 Ceftriaxone 7/24 >>>7/28  SUBJECTIVE: Feels better.  Denies chest pain, wheeze.  Decreased cough and sputum.  VITAL SIGNS: Temp:  [97.8 F (36.6 C)-98.6 F (37 C)] 98.6 F (37 C) (07/29 0544) Pulse Rate:  [61-79] 75 (07/29 0544) Resp:  [18-20] 20 (07/29 0544) BP: (116-147)/(48-92) 127/52 mmHg (07/29 0544) SpO2:  [78 %-93 %] 93 % (07/29 0544) Weight:  [153 lb 7 oz (69.6 kg)] 153 lb 7 oz (69.6 kg) (07/29 0500) 2 liters [place]  PHYSICAL EXAMINATION: General: No distress Neuro:  Awake, alert, cooperative Cardiovascular:  Regular, no m/r/g Lungs: faint basilar crackles, no wheeze Abdomen: soft, non tender Musculoskeletal: no edema Skin: no rashes  Labs: CBC Recent Labs     04/09/13  0636  WBC  5.5  HGB  10.6*  HCT  31.6*  PLT  101*    BMET Recent Labs     04/09/13  0636  04/11/13  0805  NA  138  139  K  3.6  3.8  CL  99  99  CO2  31  31  BUN  18  14  CREATININE  0.83  1.03  GLUCOSE  102*  106*     Electrolytes Recent Labs     04/09/13  0636  04/11/13  0805  CALCIUM  9.4  9.3    Imaging Dg Chest Port 1 View  04/11/2013   *RADIOLOGY REPORT*  Clinical Data: Weakness, infiltrates  PORTABLE CHEST - 1 VIEW  Comparison: April 08, 2013.  Findings: Cardiomediastinal silhouette appears normal.  Mild dextroscoliosis of thoracic spine is again noted.  Right-sided PICC line is unchanged in position with tip in expected position of the SVC.  No pneumothorax is noted.  Bilateral lung opacities noted on prior exam are improved, especially involving the left lower lobe. Right basilar and mid lung opacities remain concerning for subsegmental atelectasis or pneumonia.  Mild left basilar opacity remains consistent with subsegmental atelectasis with possible associated effusion.  IMPRESSION: Improved but persistent bilateral lung opacities are noted compared to prior exam.   Original Report Authenticated By: Lupita Raider.,  M.D.       ASSESSMENT / PLAN:  Multifocal ASD causing with sinus disease causing acute hypoxic respiratory failure >> likely infection >> much improved. P: -complete Abx per primary team -assess for home oxygen needs prior to d/c home -f/u CXR as outpt -will need PFT's as outpt to further assess question of obstructive lung disease history -may also need further allergy assessment/treatment of sinus disease as outpt  Small b/l pleural effusions >> improving with diuresis. P: -  f/u CXR as outpt  Hx of HTN. P: -per primary team  Deconditioning. P: -defer to primary team  I have scheduled her for pulmonary office follow up with Tammy Parrett on Friday, August 15 at 10 am in [place] office building on [address].  She will then need follow up with Dr. Delton Coombes after that.  PCCM will sign off.  Please call if additional help needed while she is in hospital.  Coralyn Helling, MD [facility] Pulmonary/Critical Care 04/11/2013, 9:07 AM Pager:  [phone] After 3pm call:  [phone]

## 2013-04-11 NOTE — Progress Notes (Signed)
04-11-13 Spoke with patient , confirmed facesheet information.  PT / OT recommending home health and 24 hour supervision. Discussed same with patient . Patient voiced understanding.  Patient lives alone states her sister lives close by and she  has close  neighbors. Patient does not want to have anyone stay with her .   Provided list of home health agencies to patient . Patient has had [facility] in the past and was happy with their services but wants to take some time to decide on which agency to use .   Will follow up .   Ronny Flurry RN BSN [phone]

## 2013-04-11 NOTE — Evaluation (Signed)
Occupational Therapy Evaluation Patient Details Name: Beverly Hart MRN: [id number] DOB: 10/20/1940 Today's Date: 04/11/2013 Time: 7829-5621 OT Time Calculation (min): 22 min  OT Assessment / Plan / Recommendation History of present illness With a pmh significant for hypertension, PVD, documented COPD, and documented diastolic heart failure who presented to [facility] on 04/02/13 with complaints of SOB x 1 week associated with shaking and chills. The patient was noted to initially have O2 sats in the low 80's on RA with CT findings suggestive of multifocal pneumonia vs edema. The patient was started on Rocephin and azithromycin. The patient was ultimately transitioned to Levaquin and the patient was felt to be in acute heart failure and continued on aggressive diuresis with q12 IV lasix. The patient was subsequently transferred to [facility] for further eval.   Clinical Impression   Pt is overall at min guard assist level but increased time required to complete tasks as pt fatigues and her O2 sats drop, requiring time to recover. She has assist available at d/c. Began education on energy conservation and pt will benefit from skilled OT services to continue to education further on energy conservation as well as progress her independence with ADL tasks.    OT Assessment  Patient needs continued OT Services    Follow Up Recommendations  Home health OT;Supervision/Assistance - 24 hour    Barriers to Discharge      Equipment Recommendations  None recommended by OT    Recommendations for Other Services    Frequency  Min 2X/week    Precautions / Restrictions Precautions Precautions: Fall Precaution Comments: monitor O2 sats Restrictions Weight Bearing Restrictions: No   Pertinent Vitals/Pain Lowest was 83% on 2L with activity. Up to low 90s with rest.    ADL  Eating/Feeding: Simulated;Independent Where Assessed - Eating/Feeding: Chair Grooming: Simulated;Wash/dry hands;Set  up Where Assessed - Grooming: Supported sitting Upper Body Bathing: Simulated;Chest;Right arm;Left arm;Set up;Supervision/safety (supervision for PLB, rest and monitor O2 sats) Where Assessed - Upper Body Bathing: Unsupported sitting Lower Body Bathing: Simulated;Min guard Where Assessed - Lower Body Bathing: Supported sit to stand Upper Body Dressing: Simulated;Set up;Supervision/safety Where Assessed - Upper Body Dressing: Unsupported sitting Lower Body Dressing: Simulated;Min guard Where Assessed - Lower Body Dressing: Supported sit to stand Toilet Transfer: Simulated;Min Pension scheme manager Method: Other (comment) (with walker) Toileting - Clothing Manipulation and Hygiene: Simulated;Min guard Where Assessed - Engineer, mining and Hygiene: Standing Equipment Used: Rolling walker ADL Comments: Began energy conservation education and she would benefit from further education. She needs cues for PLB technique throughout session to help with O2 sats. She has a shower seat for the tub but doesnt usually use it. Would benefit from using seat initially for energy conservation. She has family that can help PRN at d/c but pt is anxious to do things for herself and be independent. Expressed to pt that she needs to allow help PRN so that she can recover. Pt verbalized understanding. Pt states she "feel weak" when she was standing but denies dizziness.    OT Diagnosis: Generalized weakness  OT Problem List: Decreased strength;Decreased knowledge of use of DME or AE;Decreased activity tolerance OT Treatment Interventions: Self-care/ADL training;Energy conservation;DME and/or AE instruction;Patient/family education;Therapeutic activities   OT Goals(Current goals can be found in the care plan section) Acute Rehab OT Goals Patient Stated Goal: To be independent OT Goal Formulation: With patient Time For Goal Achievement: 04/25/13 Potential to Achieve Goals: Good  Visit Information   Last OT Received On: 04/11/13  Assistance Needed: +1 PT/OT Co-Evaluation/Treatment: Yes History of Present Illness: With a pmh significant for hypertension, PVD, documented COPD, and documented diastolic heart failure who presented to [facility] on 04/02/13 with complaints of SOB x 1 week associated with shaking and chills. The patient was noted to initially have O2 sats in the low 80's on RA with CT findings suggestive of multifocal pneumonia vs edema. The patient was started on Rocephin and azithromycin. The patient was ultimately transitioned to Levaquin and the patient was felt to be in acute heart failure and continued on aggressive diuresis with q12 IV lasix. The patient was subsequently transferred to [facility] for further eval.       Prior Functioning     Home Living Family/patient expects to be discharged to:: Private residence Living Arrangements: Alone Available Help at Discharge: Family Type of Home: House Home Access: Ramped entrance Home Layout: One level Home Equipment: Environmental consultant - 2 wheels;Shower seat;Bedside commode Prior Function Level of Independence: Independent Communication Communication: No difficulties Dominant Hand: Right         Vision/Perception     Cognition  Cognition Arousal/Alertness: Awake/alert Behavior During Therapy: WFL for tasks assessed/performed Overall Cognitive Status: Within Functional Limits for tasks assessed    Extremity/Trunk Assessment Upper Extremity Assessment Upper Extremity Assessment: Overall WFL for tasks assessed Lower Extremity Assessment Lower Extremity Assessment: Generalized weakness     Mobility Bed Mobility Bed Mobility: Supine to Sit;Sitting - Scoot to Edge of Bed Supine to Sit: 5: Supervision;HOB flat Sitting - Scoot to Edge of Bed: 5: Supervision Details for Bed Mobility Assistance: Supervision for safety Transfers Sit to Stand: 4: Min guard;From bed Stand to Sit: 4: Min guard;To  chair/3-in-1 Details for Transfer Assistance: Minguard for safety with cues for hand placement     Exercise     Balance Balance Balance Assessed: Yes Static Standing Balance Static Standing - Level of Assistance: 5: Stand by assistance   End of Session OT - End of Session Equipment Utilized During Treatment: Oxygen;Rolling walker Activity Tolerance: Patient limited by fatigue Patient left: in chair;with call bell/phone within reach  GO     Lennox Laity [phone] 04/11/2013, 11:23 AM

## 2013-04-11 NOTE — Care Management Note (Signed)
  Page 2 of 2   04/11/2013     3:35:07 PM   CARE MANAGEMENT NOTE 04/11/2013  Patient:  Beverly Hart, Beverly Hart   Account Number:  [id number]  Date Initiated:  04/11/2013  Documentation initiated by:  Ronny Flurry  Subjective/Objective Assessment:     Action/Plan:   Anticipated DC Date:  04/11/2013   Anticipated DC Plan:  HOME W HOME HEALTH SERVICES      DC Planning Services  CM consult      Choice offered to / List presented to:  [name]   DME arranged  [facility]      DME agency  [facility]     [facility] arranged  HH-1 RN  HH-2 PT  HH-3 OT  HH-4 NURSE'S AIDE      HH agency  [facility]   Status of service:  Completed, signed off Medicare Important Message given?   (If response is "NO", the following Medicare IM given date fields will be blank) Date Medicare IM given:   Date Additional Medicare IM given:    Discharge Disposition:    Per UR Regulation:  Reviewed for med. necessity/level of care/duration of stay  If discussed at Long Length of Stay Meetings, dates discussed:    Comments:  PCP:  Dr [name]  04-11-13 [name] . Referral given to [facility] with [name] . Ronny Flurry RN BSN [phone]   04-11-13 Spoke with patient , confirmed facesheet information.  PT / OT recommending home health and 24 hour supervision. Discussed same with patient . Patient voiced understanding.  Patient lives alone states her sister lives close by and she  has close  neighbors. Patient does not want to have anyone stay with her .  Provided list of home health agencies to patient . Patient has had [facility] in the past and was happy with their services but wants to take some time to decide on which agency to use .  Will follow up .  Ronny Flurry RN BSN [phone]      04/07/13 1229 Beverly Prime RN MSN BSN CCM Pt works, was active PTA.  States she has cane, walker, and BSC @ home if needed.  Currently on 6L/min [place], sats dropped to 82%  when assisted OOB this a.m.

## 2013-04-11 NOTE — Discharge Summary (Signed)
Physician Discharge Summary  Beverly Hart UJW:[id number] DOB: 1940-12-09 DOA: 04/06/2013  PCP: [name], MD  Admit date: 04/06/2013 Discharge date: 04/11/2013  Time spent: 30 minutes  Recommendations for Outpatient Follow-up:  1. Follow up with Pulmonary in 1-2 weeks 2. Follow up with PCP in 1-2 weeks  Discharge Diagnoses:  Active Problems:   HTN (hypertension)   Diastolic CHF   Bilateral multifical pneumonia   Acute respiratory failure with hypoxia   Pleural effusion   Acute renal insufficiency   Discharge Condition: Improved  Diet recommendation: heart healthy  Filed Weights   04/09/13 0038 04/10/13 0401 04/11/13 0500  Weight: 69 kg (152 lb 1.9 oz) 68.5 kg (151 lb 0.2 oz) 69.6 kg (153 lb 7 oz)    History of present illness:  Beverly Hart is a 72 y.o. female  With a pmh significant for hypertension, PVD, documented COPD, and documented diastolic heart failure who presented to [facility] on 04/02/13 with complaints of SOB x 1 week associated with shaking and chills. The patient was noted to initially have O2 sats in the low 80's on RA with CT findings suggestive of multifocal pneumonia vs edema. The patient was started on Rocephin and azithromycin. The patient was ultimately transitioned to Levaquin and the patient was felt to be in acute heart failure and continued on aggressive diuresis with q12 IV lasix. The patient was subsequently transferred to [facility] for further eval.  Hospital Course:  Acute respiratory failure with hypoxia / Bilateral multifical pneumonia  -The patient was continued on empiric vanc, rocephin, and azithro - Azithro was d/c'd on 7/23 and Vanco/Rocephin dc'd by Pulmonary on 7/28 - Levaquin was started on 04/06/13 - The patient has since shown continued improvement and has tolerated antibiotics. - The patient is O2 naive and was noted to have O2 sats of 87% on RA, meeting home O2 requirements  Diastolic CHF?  Well compensated at this  time  - TTE from Jan 2013 appears to be entirely normal  - clinical exam is not c/w CHF  Thrombocytopenia  ? HIT - stopped lovenox - HIT panel drawn  - The patient was instead continued on SCDs  Pleural effusions  tiny and clinically inconsequential  Reported hx of COPD  Does not appear to be on any chronic med tx - no wheezing on exam  HTN  BP stable at this time  MRSA screen +  Laceration right 1st finger  sutured 14 days ago - wound appears to be healing well with no complications - d/c sutures on 04/10/13   Procedures: 7/25 - B LE venous dopplers negative for DVT  Consultations:  Pulmonary  Discharge Exam: Filed Vitals:   04/11/13 0500 04/11/13 0544 04/11/13 1040 04/11/13 1403  BP:  127/52  99/58  Pulse:  75  72  Temp:  98.6 F (37 C)  97.5 F (36.4 C)  TempSrc:  Oral  Oral  Resp:  20  20  Height:      Weight: 69.6 kg (153 lb 7 oz)     SpO2:  93% 83% 97%    General: Awake, in nad Cardiovascular: Regular, s1, s2 Respiratory: normal resp effort, no wheezing  Discharge Instructions       Future Appointments Provider Department Dept Phone   04/28/2013 10:00 AM Julio Sicks, NP [facility] [phone]       Medication List         b complex vitamins tablet  Take 2 tablets by mouth daily.  calcium carbonate 600 MG Tabs  Commonly known as:  OS-CAL  Take 600 mg by mouth daily.     citalopram 10 MG tablet  Commonly known as:  CELEXA  Take 10 mg by mouth daily.     cloNIDine 0.1 MG tablet  Commonly known as:  CATAPRES  Take 0.1 mg by mouth 2 (two) times daily.     DSS 100 MG Caps  Take 100 mg by mouth 2 (two) times daily.     fish oil-omega-3 fatty acids 1000 MG capsule  Take 1 g by mouth daily.     furosemide 40 MG tablet  Commonly known as:  LASIX  Take 20 mg by mouth. Every Mon, Wed and Fri     GLUCOSAMINE 1500 COMPLEX Caps  Take 1 capsule by mouth daily.     levofloxacin 750 MG tablet  Commonly known as:  LEVAQUIN   Take 1 tablet (750 mg total) by mouth daily at 6 PM.     metoprolol 50 MG tablet  Commonly known as:  LOPRESSOR  Take 50 mg by mouth 2 (two) times daily.     multivitamin with minerals Tabs  Take 1 tablet by mouth daily.     potassium chloride 10 MEQ tablet  Commonly known as:  K-DUR,KLOR-CON  1 by mouth on M,W,F with Lasix       Allergies  Allergen Reactions  . Penicillins     Rash   . Sulfa Antibiotics     Rash   . Azithromycin Hives and Rash   Follow-up Information   Follow up with PARRETT,TAMMY, NP On 04/28/2013. (10:00 AM)    Contact information:   [address] [place] [address] [phone]       Follow up with [name], MD. Schedule an appointment as soon as possible for a visit in 1 week.   Contact information:   [address] [place] [address] [phone]       Schedule an appointment as soon as possible for a visit with Leslye Peer., MD.   Contact information:   [address] [place] [address] [phone]        The results of significant diagnostics from this hospitalization (including imaging, microbiology, ancillary and laboratory) are listed below for reference.    Significant Diagnostic Studies: Dg Chest 2 View  04/08/2013   *RADIOLOGY REPORT*  Clinical Data: Shortness of breath  CHEST - 2 VIEW  Comparison:  April 06, 2013  Findings:  Patchy infiltrate is noted in both lower lobes, stable. There is been partial but incomplete clearing of patchy infiltrate in the left mid lung.  There is no new opacity.  Heart size and pulmonary vascularity are normal.  No adenopathy.  There is a new central catheter with the tip at the junction of the superior vena cava and right atrium.  No pneumothorax.  Thoracic and lumbar scoliosis remains without change.  IMPRESSION: Central catheter tip at the junction of the superior vena cava and right atrium.  No pneumothorax.  There is patchy infiltrate bilaterally with partial clearing from  the left mid lung.  Other areas show no appreciable change.  No new opacity appreciable.   Original Report Authenticated By: Bretta Bang, M.D.   Dg Chest Port 1 View  04/11/2013   *RADIOLOGY REPORT*  Clinical Data: Weakness, infiltrates  PORTABLE CHEST - 1 VIEW  Comparison: April 08, 2013.  Findings: Cardiomediastinal silhouette appears normal.  Mild dextroscoliosis of thoracic spine is again noted.  Right-sided PICC line is unchanged in position with tip in expected position of the SVC.  No pneumothorax is noted.  Bilateral lung opacities noted on prior exam are improved, especially involving the left lower lobe. Right basilar and mid lung opacities remain concerning for subsegmental atelectasis or pneumonia.  Mild left basilar opacity remains consistent with subsegmental atelectasis with possible associated effusion.  IMPRESSION: Improved but persistent bilateral lung opacities are noted compared to prior exam.   Original Report Authenticated By: Lupita Raider.,  M.D.    Microbiology: Recent Results (from the past 240 hour(s))  MRSA PCR SCREENING     Status: Abnormal   Collection Time    04/06/13  6:37 PM      Result Value Range Status   MRSA by PCR POSITIVE (*) NEGATIVE Final   Comment:            The GeneXpert MRSA Assay (FDA     approved for NASAL specimens     only), is one component of a     comprehensive MRSA colonization     surveillance program. It is not     intended to diagnose MRSA     infection nor to guide or     monitor treatment for     MRSA infections.     RESULT CALLED TO, READ BACK BY AND VERIFIED WITH:     A PETTIFORD RN 2220 04/06/13 A BROWNING     Labs: Basic Metabolic Panel:  Recent Labs Lab 04/06/13 1912 04/07/13 0500 04/08/13 0415 04/09/13 0636 04/11/13 0805  NA  --  137 136 138 139  K  --  3.5 3.6 3.6 3.8  CL  --  97 94* 99 99  CO2  --  34* 35* 31 31  GLUCOSE  --  116* 111* 102* 106*  BUN  --  24* 27* 18 14  CREATININE 0.68 0.88 0.85 0.83 1.03   CALCIUM  --  9.2 9.8 9.4 9.3   Liver Function Tests:  Recent Labs Lab 04/07/13 0500  AST 24  ALT 8  ALKPHOS 78  BILITOT 0.3  PROT 6.1  ALBUMIN 2.2*   No results found for this basename: LIPASE, AMYLASE,  in the last 168 hours No results found for this basename: AMMONIA,  in the last 168 hours CBC:  Recent Labs Lab 04/06/13 1912 04/07/13 0500 04/08/13 0415 04/09/13 0636  WBC 11.0* 9.1 7.8 5.5  HGB 12.3 10.5* 10.1* 10.6*  HCT 37.1 32.3* 31.0* 31.6*  MCV 89.4 90.0 89.9 88.5  PLT 305 244 235 101*   Cardiac Enzymes: No results found for this basename: CKTOTAL, CKMB, CKMBINDEX, TROPONINI,  in the last 168 hours BNP: BNP (last 3 results)  Recent Labs  04/06/13 1912  PROBNP 294.2*   CBG: No results found for this basename: GLUCAP,  in the last 168 hours     Signed:  CHIU, STEPHEN K  [facility] 04/11/2013, 3:42 PM

## 2013-04-11 NOTE — Evaluation (Signed)
Physical Therapy Evaluation Patient Details Name: Beverly Hart MRN: [id number] DOB: 1940/11/22 Today's Date: 04/11/2013 Time: 4098-1191 PT Time Calculation (min): 33 min  PT Assessment / Plan / Recommendation History of Present Illness  Pt is 72 y/o female with a pmh significant for hypertension, PVD, documented COPD, and documented diastolic heart failure who presented to [facility] on 04/02/13 with complaints of SOB x 1 week associated with shaking and chills. The patient was noted to initially have O2 sats in the low 80's on RA with CT findings suggestive of multifocal pneumonia vs edema. The patient was started on Rocephin and azithromycin. The patient was ultimately transitioned to Levaquin and the patient was felt to be in acute heart failure and continued on aggressive diuresis with q12 IV lasix. The patient was subsequently transferred to [facility] for further eval.  Clinical Impression  Pt very limited due to overall fatigue and decrease in SaO2 with mobility (see vitals section).  Pt will benefit from acute PT services to improve overall mobitliy and prepare for safe d/c home with HHPT and family assistance.  Spoke with pt re: the need for initial assistance at home for overall safety as well as the need for RW for overall balance and endurance.      PT Assessment  Patient needs continued PT services    Follow Up Recommendations  Home health PT;Supervision/Assistance - 24 hour    Barriers to Discharge        Equipment Recommendations  None recommended by PT    Recommendations for Other Services     Frequency Min 3X/week    Precautions / Restrictions Precautions Precautions:  (need oxygen)   Pertinent Vitals/Pain Decrease Sa02 to 87% initially on RA in standing.  Returned 2L O2 increased to 93%.  Pt ambulated with 2L O2 and Sa02 decreased to 83%.  Pt was returned to sitting and increase supplemental O2 to 3L and SaO2 increased to 96% with pursed lipped  breathing.       Mobility  Bed Mobility Bed Mobility: Supine to Sit;Sitting - Scoot to Edge of Bed Supine to Sit: 5: Supervision;HOB flat Sitting - Scoot to Edge of Bed: 5: Supervision Details for Bed Mobility Assistance: Supervision for safety Transfers Transfers: Sit to Stand;Stand to Sit Sit to Stand: 4: Min guard;From bed Stand to Sit: 4: Min guard;To chair/3-in-1 Details for Transfer Assistance: Minguard for safety with cues for hand placement Ambulation/Gait Ambulation/Gait Assistance: 4: Min guard Ambulation Distance (Feet): 30 Feet Assistive device: Rolling walker Ambulation/Gait Assistance Details: Minguard for safety with cues for RW placement and body position within RW.  Pt tends to ambulate too far from RW Gait Pattern: Step-through pattern;Decreased stride length Gait velocity: decreased Stairs: No    Exercises     PT Diagnosis: Difficulty walking;Generalized weakness  PT Problem List: Decreased strength;Decreased activity tolerance;Decreased balance;Decreased mobility;Decreased cognition;Decreased knowledge of use of DME;Cardiopulmonary status limiting activity PT Treatment Interventions: DME instruction;Gait training;Functional mobility training;Therapeutic activities;Therapeutic exercise;Balance training;Patient/family education     PT Goals(Current goals can be found in the care plan section) Acute Rehab PT Goals Patient Stated Goal: To be independent PT Goal Formulation: With patient Time For Goal Achievement: 04/18/13 Potential to Achieve Goals: Good  Visit Information  Last PT Received On: 04/11/13 Assistance Needed: +1 PT/OT Co-Evaluation/Treatment: Yes History of Present Illness: With a pmh significant for hypertension, PVD, documented COPD, and documented diastolic heart failure who presented to [facility] on 04/02/13 with complaints of SOB x 1 week associated with shaking  and chills. The patient was noted to initially have O2 sats in the low  80's on RA with CT findings suggestive of multifocal pneumonia vs edema. The patient was started on Rocephin and azithromycin. The patient was ultimately transitioned to Levaquin and the patient was felt to be in acute heart failure and continued on aggressive diuresis with q12 IV lasix. The patient was subsequently transferred to [facility] for further eval.       Prior Functioning  Home Living Family/patient expects to be discharged to:: Private residence Living Arrangements: Alone Available Help at Discharge: Family Type of Home: House Home Access: Ramped entrance Home Layout: One level Home Equipment: Environmental consultant - 2 wheels;Shower seat Prior Function Level of Independence: Independent Communication Communication: No difficulties Dominant Hand: Right    Cognition  Cognition Arousal/Alertness: Awake/alert Behavior During Therapy: WFL for tasks assessed/performed Overall Cognitive Status: Within Functional Limits for tasks assessed    Extremity/Trunk Assessment Lower Extremity Assessment Lower Extremity Assessment: Generalized weakness   Balance    End of Session PT - End of Session Equipment Utilized During Treatment: Gait belt;Oxygen (2-3L) Activity Tolerance: Patient limited by fatigue Patient left: in chair;with call bell/phone within reach Nurse Communication: Mobility status  GP     Yandel Zeiner 04/11/2013, 11:14 AM  Jake Shark, PT DPT [phone]

## 2013-04-11 NOTE — Progress Notes (Addendum)
SATURATION QUALIFICATIONS: (This note is used to comply with regulatory documentation for home oxygen)  Patient Saturations on Room Air at Rest = 90%  Patient Saturations on Room Air while Ambulating = 87% (Standing)  Patient Saturations on 2 Liters of oxygen while Ambulating = 89% decreased to 83% (Pt returned to sitting and increased O2 to 3L.  Pt quickly returned to 95% on 3L in sitting.)  Please briefly explain why patient needs home oxygen:  Pt quickly fatigues without oxygen supplement and able to improve SaO2 with increase oxygen and pursed lip breathing.   [place], [place] DPT [phone]

## 2013-04-13 LAB — HEPARIN INDUCED THROMBOCYTOPENIA PNL
UFH Low Dose 0.1 IU/mL: 0 % Release
UFH Low Dose 0.5 IU/mL: 1 % Release
UFH SRA Result: NEGATIVE

## 2013-04-28 ENCOUNTER — Ambulatory Visit (INDEPENDENT_AMBULATORY_CARE_PROVIDER_SITE_OTHER)
Admission: RE | Admit: 2013-04-28 | Discharge: 2013-04-28 | Disposition: A | Discharge status: Home / Self Care | Payer: Commercial Managed Care - PPO | Source: Ambulatory Visit | Attending: Adult Health | Admitting: Adult Health

## 2013-04-28 ENCOUNTER — Encounter: Payer: Self-pay | Admitting: Adult Health

## 2013-04-28 ENCOUNTER — Ambulatory Visit (INDEPENDENT_AMBULATORY_CARE_PROVIDER_SITE_OTHER): Payer: Commercial Managed Care - PPO | Admitting: Adult Health

## 2013-04-28 VITALS — BP 122/68 | HR 66 | Temp 98.3°F | Ht 62.0 in | Wt 150.2 lb

## 2013-04-28 DIAGNOSIS — J189 Pneumonia, unspecified organism: Secondary | ICD-10-CM

## 2013-04-28 NOTE — Patient Instructions (Addendum)
Continue on current regimen  follow up Dr. Delton Coombes  In 2-3 weeks with chest xray  Please contact office for sooner follow up if symptoms do not improve or worsen or seek emergency care

## 2013-04-30 NOTE — Progress Notes (Signed)
  Subjective:    Patient ID: Beverly Hart, female    DOB: 01/01/1941, 72 y.o.   MRN: [id number]  HPI 72 yo woman, never smoker, hx HTN, hyperlipidemia. Admitted with bilateral infiltrates and hypoxemic resp failure to [facility] in January 2013. She reports that she was well leading up to the day of this admission. She developed acute respiratory distress without any fever, chills, cough, sputum production or other infectious prodrome. On initial evaluation her systolic blood pressure was greater than 200. She was treated for a possible community-acquired pneumonia although her overall presentation appears to be more consistent with acute diastolic dysfunction and pulmonary edema. Her course was complicated by mild renal insufficiency with a serum creatinine of 1.5. Notation was made of possible COPD with acute exacerbation contributing to her decompensation although she has no history of tobacco use or asthma. She improved with diuresis and was discharged home but was unfortunately readmitted 2 weeks later, this time with some cough and associated fever. Her bilateral infiltrates were asymmetric and she was again treated for possible pneumonia.   She has been evaluated by cardiology and has an echocardiogram that showed diastolic dysfunction with preserved LVEF. She had mild TR and her right ventricular function was felt to be normal. She was discharged on home oxygen but has weaned herself off of this. Her exercise tolerance and overall pulmonary status has improved with diuresis and better blood pressure control. She presents now for evaluation of possible underlying lung disease. She is much improved and feels like she is approaching her previous baseline.  04/28/13 Post Hospital follow up  Patient returns for a two-week post hospital followup. She was admitted July 24 of the 29th for bilateral, multifocal pneumonia and sinusitis  CT chest [facility]) 7/20 >> diffuse, patchy b/l ASD, small hiatal  hernia  CT sinus [facility]) 7/20 >> ethmoid/Rt frontal sinus opacification, mild/diffuse white matter changes  presented to [facility] on 04/02/13 with complaints of SOB x 1 week associated with shaking and chills. The patient was noted to initially have O2 sats in the low 80's on RA with CT findings suggestive of multifocal pneumonia vs edema. The patient was started on Rocephin and azithromycin. The patient was ultimately transitioned to Levaquin and the patient was felt to be in acute heart failure and continued on aggressive diuresis with q12 IV lasix. The patient was subsequently transferred to [facility] for further eval.  Discharged on levaquin which she has completed. Feeling better but still weak.  CXR today is improved with decreasing bilateral infiltrates.  She denies any hemoptysis, orthopnea, PND, or increased leg swelling  Review of Systems  Reviewed in detail , neg except HPI      Objective:   Physical Exam  Gen: Pleasant, elderly in wheelchair   ENT: No lesions,  mouth clear,  oropharynx clear, no postnasal drip  Neck: No JVD, no TMG, no carotid bruits  Lungs: No use of accessory muscles, no dullness to percussion, diminshed BS in bases  Cardiovascular: RRR, heart sounds normal, no murmur or gallops, no peripheral edema  Musculoskeletal: No deformities, no cyanosis or clubbing  Neuro: alert, non focal  Skin: Warm, no lesions or rashes     Assessment & Plan:

## 2013-04-30 NOTE — Assessment & Plan Note (Signed)
Multifocal pneumonia, with associated sinusitis Clinically, improving. Chest x-ray today shows decreased bilateral infiltrates. We'll have her continue her current regimen. Continue on oxygen therapy. Follow back here in 2 weeks with Dr. Delton Coombes with followup. Chest x-ray.

## 2013-05-12 ENCOUNTER — Other Ambulatory Visit (HOSPITAL_COMMUNITY): Payer: Self-pay | Admitting: Emergency Medicine

## 2013-05-12 ENCOUNTER — Ambulatory Visit (INDEPENDENT_AMBULATORY_CARE_PROVIDER_SITE_OTHER): Payer: Commercial Managed Care - PPO | Admitting: Emergency Medicine

## 2013-05-12 ENCOUNTER — Encounter: Payer: Self-pay | Admitting: Emergency Medicine

## 2013-05-12 VITALS — BP 150/70 | HR 64 | Ht 62.0 in | Wt 152.4 lb

## 2013-05-12 DIAGNOSIS — I5030 Unspecified diastolic (congestive) heart failure: Secondary | ICD-10-CM

## 2013-05-12 DIAGNOSIS — I509 Heart failure, unspecified: Secondary | ICD-10-CM

## 2013-05-12 DIAGNOSIS — R1310 Dysphagia, unspecified: Secondary | ICD-10-CM

## 2013-05-12 DIAGNOSIS — J189 Pneumonia, unspecified organism: Secondary | ICD-10-CM

## 2013-05-12 NOTE — Assessment & Plan Note (Addendum)
Clinically improving. Unclear why she would have B PNA again > need to r/o aspiration. Certainly must also consider that this was diastolic CHF - check oximetry and ONO - continue current BP and diuretic regimen - check MBS - rov 3 months

## 2013-05-12 NOTE — Progress Notes (Signed)
Subjective:    Patient ID: Beverly Hart, female    DOB: 11/05/1940, 72 y.o.   MRN: [id number]  HPI 72 yo woman, never smoker, hx HTN, hyperlipidemia. Admitted with bilateral infiltrates and hypoxemic resp failure to [facility] in January 2013. She reports that she was well leading up to the day of this admission. She developed acute respiratory distress without any fever, chills, cough, sputum production or other infectious prodrome. On initial evaluation her systolic blood pressure was greater than 200. She was treated for a possible community-acquired pneumonia although her overall presentation appears to be more consistent with acute diastolic dysfunction and pulmonary edema. Her course was complicated by mild renal insufficiency with a serum creatinine of 1.5. Notation was made of possible COPD with acute exacerbation contributing to her decompensation although she has no history of tobacco use or asthma. She improved with diuresis and was discharged home but was unfortunately readmitted 2 weeks later, this time with some cough and associated fever. Her bilateral infiltrates were asymmetric and she was again treated for possible pneumonia.   She has been evaluated by cardiology and has an echocardiogram that showed diastolic dysfunction with preserved LVEF. She had mild TR and her right ventricular function was felt to be normal. She was discharged on home oxygen but has weaned herself off of this. Her exercise tolerance and overall pulmonary status has improved with diuresis and better blood pressure control. She presents now for evaluation of possible underlying lung disease. She is much improved and feels like she is approaching her previous baseline.  04/28/13 Post Hospital follow up  Patient returns for a two-week post hospital followup. She was admitted July 24 of the 29th for bilateral, multifocal pneumonia and sinusitis  CT chest [facility]) 7/20 >> diffuse, patchy b/l ASD, small hiatal  hernia  CT sinus [facility]) 7/20 >> ethmoid/Rt frontal sinus opacification, mild/diffuse white matter changes  presented to [facility] on 04/02/13 with complaints of SOB x 1 week associated with shaking and chills. The patient was noted to initially have O2 sats in the low 80's on RA with CT findings suggestive of multifocal pneumonia vs edema. The patient was started on Rocephin and azithromycin. The patient was ultimately transitioned to Levaquin and the patient was felt to be in acute heart failure and continued on aggressive diuresis with q12 IV lasix. The patient was subsequently transferred to [facility] for further eval.  Discharged on levaquin which she has completed. Feeling better but still weak.  CXR today is improved with decreasing bilateral infiltrates.  She denies any hemoptysis, orthopnea, PND, or increased leg swelling  ROV 05/12/13 -- follows up for hospitalization w acute resp failure and infiltrates, dx as PNA vs pulm edema.  Returns feeling better. Better functional capacity. Feels closer to baseline but not quite there. She has had some intermittent cough, white sputum.   Review of Systems  Reviewed in detail , neg except HPI   Objective:  Physical Exam Filed Vitals:   05/12/13 1337  BP: 150/70  Pulse: 64  Height: 5\' 2"  (1.575 m)  Weight: 152 lb 6.4 oz (69.128 kg)  SpO2: 98%   Gen: Pleasant, elderly woman  ENT: No lesions,  mouth clear,  oropharynx clear, no postnasal drip  Neck: No JVD, no TMG, no carotid bruits  Lungs: No use of accessory muscles, diminshed BS in bases  Cardiovascular: RRR, heart sounds normal, no murmur or gallops, no peripheral edema  Musculoskeletal: No deformities, no cyanosis or clubbing  Neuro:  alert, non focal  Skin: Warm, no lesions or rashes     Assessment & Plan:   Bilateral multifical pneumonia Clinically improving. Unclear why she would have B PNA again > need to r/o aspiration. Certainly must also consider  that this was diastolic CHF - check oximetry and ONO - continue current BP and diuretic regimen - check MBS - rov 3 months

## 2013-05-12 NOTE — Assessment & Plan Note (Signed)
Will arrange f/u w Dr Gala Romney

## 2013-05-12 NOTE — Patient Instructions (Addendum)
Please continue your current medications  We will check your oxygen levels today and will also order an overnight oxygen test We will check a swallowing test to insure that you are not aspirating Arrange followup with Dr Gala Romney Follow with Dr Delton Coombes in 3 months or sooner if you have any problems.

## 2013-05-12 NOTE — Addendum Note (Signed)
Addended by: Leslye Peer on: 05/12/2013 02:29 PM   Modules accepted: Orders

## 2013-05-16 ENCOUNTER — Other Ambulatory Visit (HOSPITAL_COMMUNITY): Payer: Commercial Managed Care - PPO

## 2013-05-16 ENCOUNTER — Ambulatory Visit (HOSPITAL_COMMUNITY): Payer: Commercial Managed Care - PPO

## 2013-05-16 ENCOUNTER — Ambulatory Visit (HOSPITAL_COMMUNITY): Admission: RE | Admit: 2013-05-16 | Payer: Commercial Managed Care - PPO | Source: Ambulatory Visit

## 2013-05-19 ENCOUNTER — Other Ambulatory Visit (HOSPITAL_COMMUNITY): Payer: Commercial Managed Care - PPO

## 2013-05-22 ENCOUNTER — Ambulatory Visit (HOSPITAL_COMMUNITY)
Admission: RE | Admit: 2013-05-22 | Discharge: 2013-05-22 | Disposition: A | Discharge status: Home / Self Care | Payer: Commercial Managed Care - PPO | Source: Ambulatory Visit | Attending: Emergency Medicine | Admitting: Emergency Medicine

## 2013-05-22 DIAGNOSIS — J449 Chronic obstructive pulmonary disease, unspecified: Secondary | ICD-10-CM | POA: Insufficient documentation

## 2013-05-22 DIAGNOSIS — J4489 Other specified chronic obstructive pulmonary disease: Secondary | ICD-10-CM | POA: Insufficient documentation

## 2013-05-22 DIAGNOSIS — J189 Pneumonia, unspecified organism: Secondary | ICD-10-CM

## 2013-05-22 DIAGNOSIS — R1310 Dysphagia, unspecified: Secondary | ICD-10-CM

## 2013-05-22 NOTE — Procedures (Signed)
Objective Swallowing Evaluation: Modified Barium Swallowing Study  Patient Details  Name: Beverly Hart MRN: [id number] Date of Birth: 19-Jul-1941  Today's Date: 05/22/2013 Time: 4098-1191 SLP Time Calculation (min): 30 min  Past Medical History:  Past Medical History  Diagnosis Date  . Acute bronchitis   . Anemia   . Community acquired pneumonia   . Hypertension   . Myalgia and myositis   . Osteoarthritis   . Peripheral vascular disease     noted on carotid ultrasound  . SOB (shortness of breath)   . Solitary thyroid nodule     hyperdense on the right lobe    Past Surgical History:  Past Surgical History  Procedure Laterality Date  . Cholecystectomy  05/1999   HPI:  72 yo female referred by Dr Delton Coombes for MBS due to concerns pt may be aspirating.  Pt PMH + for recurrent pulmonary infections, small hiatal hernia.  Pt denies dysphagia, but daughter April reports mother "strangles" on liquids at times - more so toward evening and at end of a meal.  Pt does take Tagamet and Tums PRN but states this is infrequent and she denies reflux symptoms unless she eats late at night.  She does admit to recently becoming "choked" on a chocolate covered peanut.  Pt was able to cough to clear peanut that was accompanied by frothy secretions and this incident reported caused daughter concern and desire for work up.   Pt denies weight loss nor ever requiring heimlich manuever.       Assessment / Plan / Recommendation Clinical Impression  Dysphagia Diagnosis: Within Functional Limits;Suspected primary esophageal dysphagia   Clinical impression: Pt presents with functional oropharyngeal swallow with symptoms consistent with esophageal/possible GERD.  No aspiration or penetration observed with any consistency tested.  Swallow was timely without significant stasis.  Trace oropharyngeal stasis noted without pt awareness.    Suspect pt's primary symptoms may be consistent with reflux.  Pt's score on Reflux  Symptom Index was 15/45, indicating possible laryngopharyngeal reflux.  Appearance of mildly slow clearance with pudding and mild retrograde propulsion of liquid from distal to mid-esophagus without pt awareness. Radiologist not present.   Due to pt's COPD, pt may be at risk of episodic aspiration therefore SLP advised her to use caution with eating.  Also provided written education re: reflux precautions given pt report that she was diagnosed with reflux "years ago".  Xerostomia tips provided verbally and in writing.  Pt and daughter April educated to findings of test, compensation strategies, etc.  Thanks for this referral.      Treatment Recommendation    Consider possible reflux work up/management   Diet Recommendation Regular;Thin liquid (extra gravy and sauce)   Liquid Administration via: Cup;Straw Medication Administration: Whole meds with liquid Supervision: Patient able to self feed Compensations: Slow rate;Small sips/bites Postural Changes and/or Swallow Maneuvers: Seated upright 90 degrees;Upright 30-60 min after meal    Other  Recommendations Oral Care Recommendations: Oral care QID   Follow Up Recommendations  None           SLP Swallow Goals     General Date of Onset: 05/22/13 HPI: 72 yo female referred by Dr Delton Coombes for MBS due to concerns pt may be aspirating.  Pt PMH + for recurrent pulmonary infections, small hiatal hernia.  Pt denies dysphagia, but daughter April reports mother "strangles" on liquids at times - more so toward evening and at end of a meal.  Pt does take Tagamet and Tums PRN but  states this is infrequent and she denies reflux symptoms unless she eats late at night.  She does admit to recently becoming "choked" on a chocolate covered peanut.  Pt was able to cough to clear peanut that was accompanied by frothy secretions.   Type of Study: Modified Barium Swallowing Study Reason for Referral: Objectively evaluate swallowing function Diet Prior to this  Study: Regular;Thin liquids Respiratory Status: Room air (uses oxygen when walking) History of Recent Intubation: No Behavior/Cognition: Alert;Cooperative;Pleasant mood Oral Cavity - Dentition: Missing dentition;Dentures, top (two lower dentition on left side, pt masticates on left) Oral Motor / Sensory Function: Within functional limits Self-Feeding Abilities: Able to feed self Patient Positioning: Upright in chair Baseline Vocal Quality: Clear Volitional Cough: Strong Volitional Swallow: Able to elicit Anatomy: Within functional limits Pharyngeal Secretions: Not observed secondary MBS    Reason for Referral Objectively evaluate swallowing function   Oral Phase Oral Preparation/Oral Phase Oral Phase: WFL (trace oral stasis, cleared with further swallow) Oral - Nectar Oral - Nectar Cup: Within functional limits Oral - Thin Oral - Thin Cup: Within functional limits Oral - Thin Straw: Within functional limits Oral - Solids Oral - Puree: Within functional limits Oral - Regular: Within functional limits Oral - Pill: Within functional limits   Pharyngeal Phase Pharyngeal Phase Pharyngeal Phase: Within functional limits Pharyngeal - Nectar Pharyngeal - Nectar Cup: Within functional limits Pharyngeal - Thin Pharyngeal - Thin Cup: Within functional limits Pharyngeal - Thin Straw: Within functional limits Pharyngeal - Solids Pharyngeal - Puree: Within functional limits Pharyngeal - Regular: Within functional limits Pharyngeal - Pill: Within functional limits  Cervical Esophageal Phase    GO    Cervical Esophageal Phase Cervical Esophageal Phase: Impaired Cervical Esophageal Phase - Comment Cervical Esophageal Comment: appearance of mildly slow clearance with pudding and mild retrograde propulsion of liquid from distal to mid=esophagus without pt awareness    Functional Assessment Tool Used: mbs, clinical judgement Functional Limitations: Swallowing Swallow Current Status  (R6045): At least 1 percent but less than 20 percent impaired, limited or restricted Swallow Goal Status [phone]): At least 1 percent but less than 20 percent impaired, limited or restricted Swallow Discharge Status [phone]): At least 1 percent but less than 20 percent impaired, limited or restricted   Donavan Burnet, MS [facility] SLP [phone]

## 2013-05-25 ENCOUNTER — Encounter: Payer: Self-pay | Admitting: Emergency Medicine

## 2013-05-25 NOTE — Telephone Encounter (Signed)
Please advise Dr. Delton Coombes if you feel levaquin and pred taper is appropriate for pt.

## 2013-06-08 ENCOUNTER — Ambulatory Visit: Payer: Commercial Managed Care - PPO | Admitting: Emergency Medicine

## 2013-06-15 ENCOUNTER — Encounter (HOSPITAL_COMMUNITY): Payer: Commercial Managed Care - PPO

## 2013-06-19 ENCOUNTER — Ambulatory Visit (INDEPENDENT_AMBULATORY_CARE_PROVIDER_SITE_OTHER): Payer: Commercial Managed Care - PPO | Admitting: Adult Health

## 2013-06-19 ENCOUNTER — Ambulatory Visit (INDEPENDENT_AMBULATORY_CARE_PROVIDER_SITE_OTHER)
Admission: RE | Admit: 2013-06-19 | Discharge: 2013-06-19 | Disposition: A | Discharge status: Home / Self Care | Payer: Commercial Managed Care - PPO | Source: Ambulatory Visit | Attending: Adult Health | Admitting: Adult Health

## 2013-06-19 ENCOUNTER — Encounter: Payer: Self-pay | Admitting: Adult Health

## 2013-06-19 VITALS — BP 142/68 | HR 94 | Temp 97.7°F | Ht 62.0 in | Wt 155.4 lb

## 2013-06-19 DIAGNOSIS — J189 Pneumonia, unspecified organism: Secondary | ICD-10-CM

## 2013-06-19 DIAGNOSIS — Z23 Encounter for immunization: Secondary | ICD-10-CM

## 2013-06-19 NOTE — Patient Instructions (Addendum)
GERD diet  May try Prilosec 20mg  daily before meal for reflux.  Avoid Mint products.  I will call with xray results.  We will call with overnight oximetry results.   Follow up Dr. Delton Coombes  In 3 months  Please contact office for sooner follow up if symptoms do not improve or worsen or seek emergency care  Flu shot today

## 2013-06-19 NOTE — Assessment & Plan Note (Addendum)
Clinically improved , serial xray have been improving  Will check xray today to document total resolution  Check ONO to determine if O2 is needed. Ambulatory walk with no desats.  MBS w/ no aspiration noted.  Does have some mild GERD -advised may use PPI and discuss further with her PCP .  Plan  GERD diet  May try Prilosec 20mg  daily before meal for reflux.  Avoid Mint products.  I will call with xray results.  We will call with overnight oximetry results.   Follow up Dr. Delton Coombes  In 3 months  Please contact office for sooner follow up if symptoms do not improve or worsen or seek emergency care  Flu shot today

## 2013-06-19 NOTE — Progress Notes (Signed)
Subjective:    Patient ID: Beverly Hart, female    DOB: 1941/05/15, 72 y.o.   MRN: [id number]  HPI 72 yo woman, never smoker, hx HTN, hyperlipidemia. Admitted with bilateral infiltrates and hypoxemic resp failure to [facility] in January 2013. She reports that she was well leading up to the day of this admission. She developed acute respiratory distress without any fever, chills, cough, sputum production or other infectious prodrome. On initial evaluation her systolic blood pressure was greater than 200. She was treated for a possible community-acquired pneumonia although her overall presentation appears to be more consistent with acute diastolic dysfunction and pulmonary edema. Her course was complicated by mild renal insufficiency with a serum creatinine of 1.5. Notation was made of possible COPD with acute exacerbation contributing to her decompensation although she has no history of tobacco use or asthma. She improved with diuresis and was discharged home but was unfortunately readmitted 2 weeks later, this time with some cough and associated fever. Her bilateral infiltrates were asymmetric and she was again treated for possible pneumonia.   She has been evaluated by cardiology and has an echocardiogram that showed diastolic dysfunction with preserved LVEF. She had mild TR and her right ventricular function was felt to be normal. She was discharged on home oxygen but has weaned herself off of this. Her exercise tolerance and overall pulmonary status has improved with diuresis and better blood pressure control. She presents now for evaluation of possible underlying lung disease. She is much improved and feels like she is approaching her previous baseline.  04/28/13 Post Hospital follow up  Patient returns for a two-week post hospital followup. She was admitted July 24 of the 29th for bilateral, multifocal pneumonia and sinusitis  CT chest Beverly Hart) 7/20 >> diffuse, patchy b/l ASD, small hiatal  hernia  CT sinus Beverly Hart) 7/20 >> ethmoid/Rt frontal sinus opacification, mild/diffuse white matter changes  presented to [facility] on 04/02/13 with complaints of SOB x 1 week associated with shaking and chills. The patient was noted to initially have O2 sats in the low 80's on RA with CT findings suggestive of multifocal pneumonia vs edema. The patient was started on Rocephin and azithromycin. The patient was ultimately transitioned to Levaquin and the patient was felt to be in acute heart failure and continued on aggressive diuresis with q12 IV lasix. The patient was subsequently transferred to [facility] for further eval.  Discharged on levaquin which she has completed. Feeling better but still weak.  CXR today is improved with decreasing bilateral infiltrates.  She denies any hemoptysis, orthopnea, PND, or increased leg swelling  ROV 05/12/13 -- follows up for hospitalization w acute resp failure and infiltrates, dx as PNA vs pulm edema.  Returns feeling better. Better functional capacity. Feels closer to baseline but not quite there. She has had some intermittent cough, white sputum.   ROV 06/19/2013 -returns for follow up with daughter .  Says she is doing well and wants to return back to work. 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  Did have difficulty with swallowing and underwent MBS 05/22/13 that was neg for aspiration.  She did have bronchitis flare 1 month ago , seen by PCP rx abx . She has recovered with resolved cough and congestion . Says she uses her O2 very little. Wants to have it discontinued. ONO was done , we have requested results. Ambulatory walk in office today with no desats.  Does have intermittent heartburn . Eats mints a lot and has throat  clearing-feels it is a habit.  She denies any hemoptysis, orthopnea, PND, leg swelling.    Review of Systems  Reviewed in detail , neg except HPI   Objective:  Physical Exam Filed Vitals:   06/19/13 1049  BP: 142/68   Pulse: 94  Temp: 97.7 F (36.5 C)  TempSrc: Oral  Height: 5\' 2"  (1.575 m)  Weight: 155 lb 6.4 oz (70.489 kg)  SpO2: 94%   Gen: Pleasant, elderly woman  ENT: No lesions,  mouth clear,  oropharynx clear, no postnasal drip  Neck: No JVD, no TMG, no carotid bruits  Lungs: No use of accessory muscles, diminshed BS in bases  Cardiovascular: RRR, heart sounds normal, no murmur or gallops, no peripheral edema  Musculoskeletal: No deformities, no cyanosis or clubbing  Neuro: alert, non focal  Skin: Warm, no lesions or rashes    04/28/13 CXR Mild residual infiltrate bilaterally consistent with resolving  pneumonia.  Assessment & Plan:   No problem-specific assessment & plan notes found for this encounter.

## 2013-06-19 NOTE — Addendum Note (Signed)
Addended by: Boone Master E on: 06/19/2013 11:31 AM   Modules accepted: Orders

## 2013-08-23 ENCOUNTER — Encounter: Payer: Self-pay | Admitting: Emergency Medicine

## 2013-08-24 ENCOUNTER — Encounter: Payer: Self-pay | Admitting: Emergency Medicine

## 2013-08-29 ENCOUNTER — Telehealth: Payer: Self-pay | Admitting: Emergency Medicine

## 2013-08-29 NOTE — Telephone Encounter (Signed)
Called patient to schd a recall apt x3. No return call back. sent letter 08/29/13 °

## 2014-11-05 IMAGING — CR DG CHEST 2V
2 series · 2 of 2 positions shown · non-contrast
Comparison: 04/11/2013

CLINICAL DATA: Follow-up pneumonia

CHEST - 2 VIEW

[view not recorded (1 of 2)]
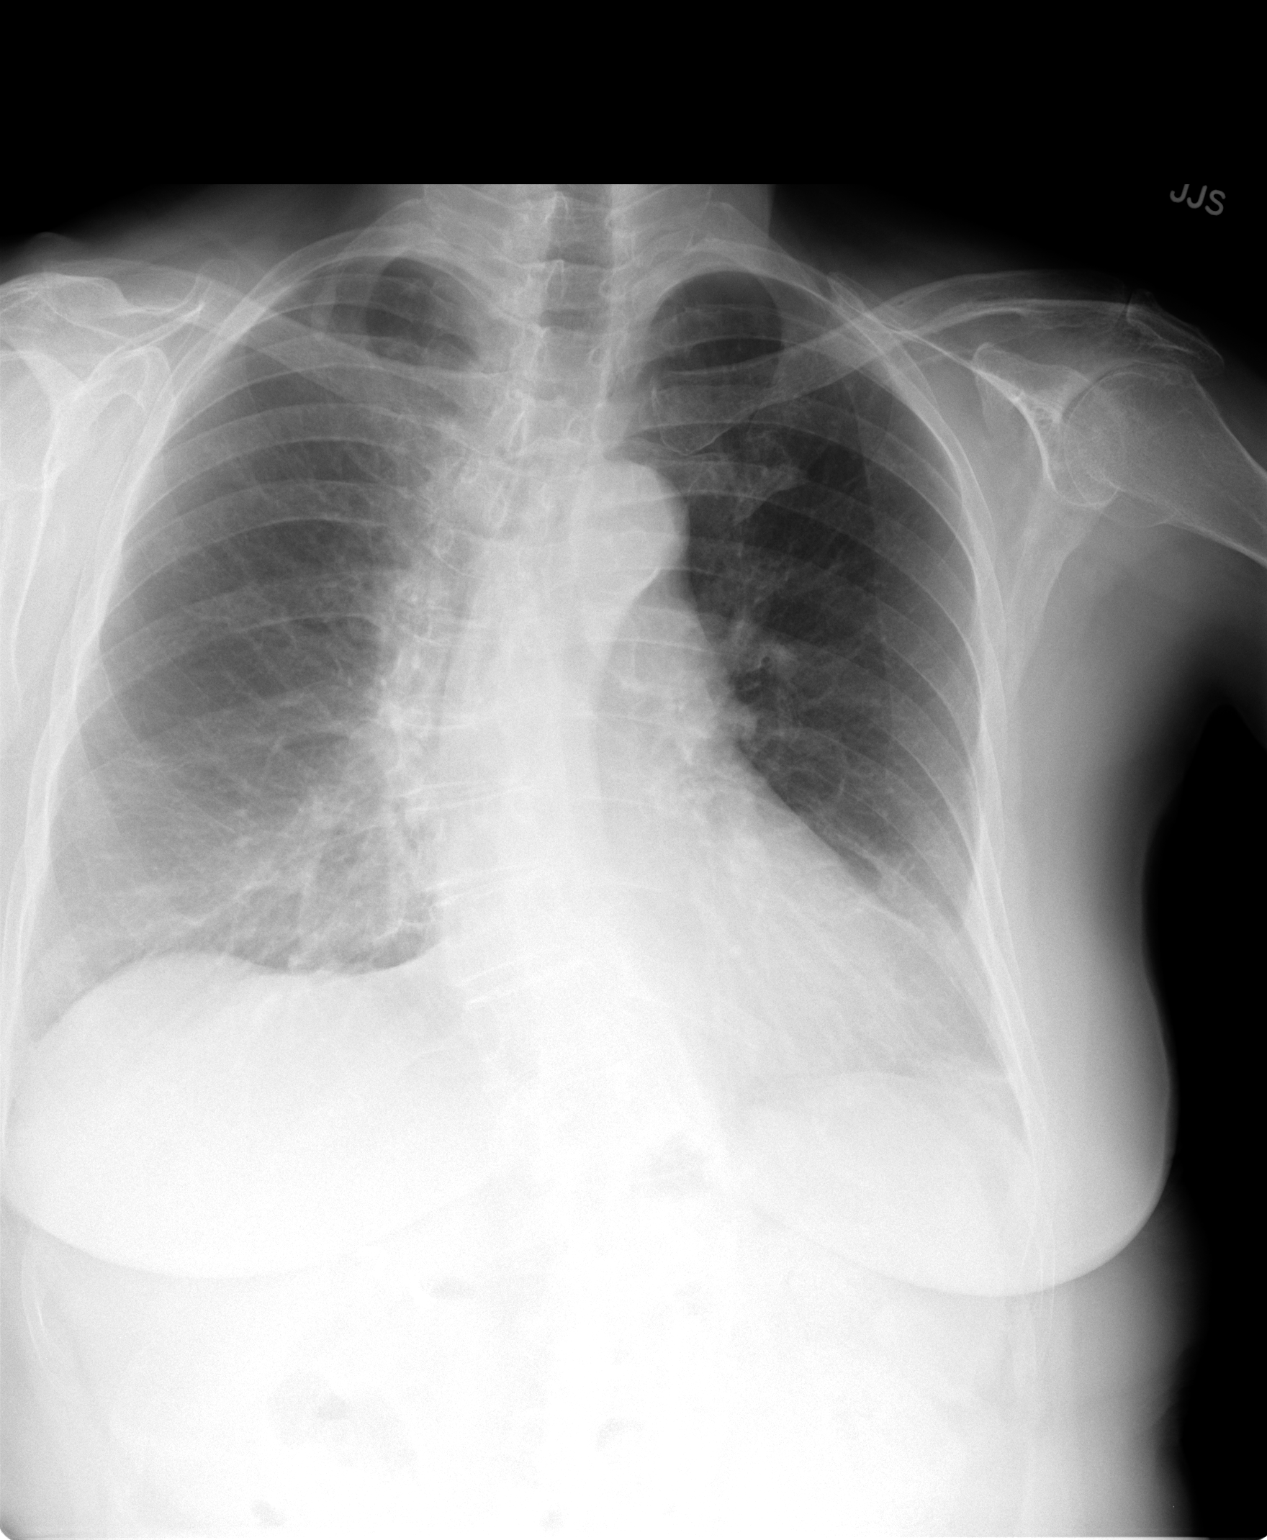

[view not recorded (2 of 2)]
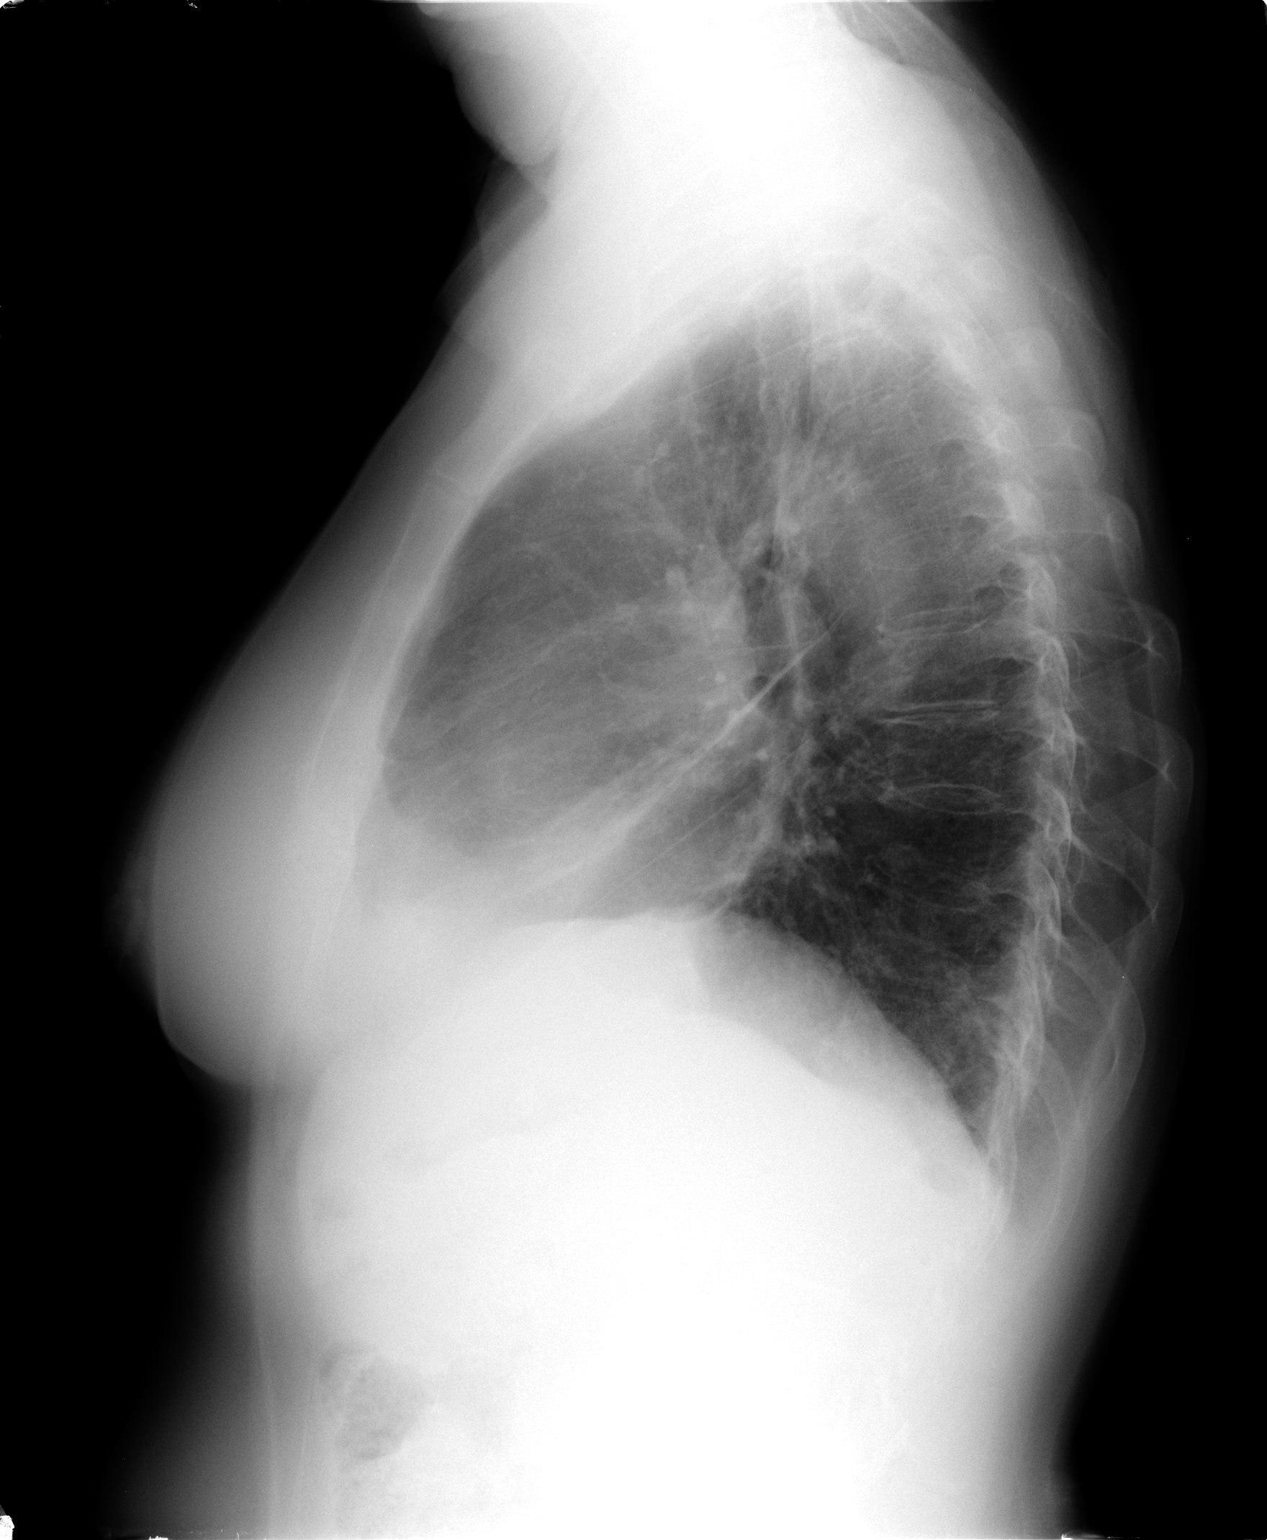

[2 of 2 positions shown; findings below may reference images not displayed]

FINDINGS: Scoliosis stable.  PICC line has been removed.  Mild
residual left lower lobe linear opacity identified.  Similarly,
there are a few linear opacities in the right lung base.  There is
significant improvement in the appearance of bilateral infiltrates.
There are no pleural effusions.
IMPRESSION: Mild residual infiltrate bilaterally consistent with resolving
pneumonia.  A follow-up radiograph in approximately 2 weeks to
ensure complete resolution is suggested.

## 2015-12-12 ENCOUNTER — Ambulatory Visit: Payer: Commercial Managed Care - PPO | Admitting: Emergency Medicine

## 2016-01-02 ENCOUNTER — Ambulatory Visit (INDEPENDENT_AMBULATORY_CARE_PROVIDER_SITE_OTHER): Payer: Commercial Managed Care - PPO | Admitting: Emergency Medicine

## 2016-01-02 ENCOUNTER — Ambulatory Visit (INDEPENDENT_AMBULATORY_CARE_PROVIDER_SITE_OTHER)
Admission: RE | Admit: 2016-01-02 | Discharge: 2016-01-02 | Disposition: A | Discharge status: Home / Self Care | Payer: Commercial Managed Care - PPO | Source: Ambulatory Visit | Attending: Emergency Medicine | Admitting: Emergency Medicine

## 2016-01-02 ENCOUNTER — Encounter: Payer: Self-pay | Admitting: Emergency Medicine

## 2016-01-02 VITALS — BP 110/82 | HR 54 | Ht 62.0 in | Wt 152.0 lb

## 2016-01-02 DIAGNOSIS — R0600 Dyspnea, unspecified: Secondary | ICD-10-CM

## 2016-01-02 NOTE — Assessment & Plan Note (Addendum)
I believe her remote hospitalizations were related to diastolic CHF. Most recently she had influenza and certainly could've had a component of bronchospasm with this. Her history and frequent bronchitis do suggest COPD, possibly fixed asthma due to her inhaled exposure through work. I believe she needs full pulmonary function testing to define this. We will also perform a walking oximetry today to see if we can truly discontinue her home oxygen that was started after her hospitalization in March. I will see her after her pulmonary function testing to review and to consider bronchodilators. Chest x-ray today

## 2016-01-02 NOTE — Progress Notes (Signed)
Subjective:    Patient ID: Beverly Hart, female    DOB: [birth year], 75 y.o.   MRN: [id number]  HPI 75 yo woman, never smoker, hx HTN, hyperlipidemia. Admitted with bilateral infiltrates and hypoxemic resp failure to [facility] in January 2013. She reports that she was well leading up to the day of this admission. She developed acute respiratory distress without any fever, chills, cough, sputum production or other infectious prodrome. On initial evaluation her systolic blood pressure was greater than 200. She was treated for a possible community-acquired pneumonia although her overall presentation appears to be more consistent with acute diastolic dysfunction and pulmonary edema. Her course was complicated by mild renal insufficiency with a serum creatinine of 1.5. Notation was made of possible COPD with acute exacerbation contributing to her decompensation although she has no history of tobacco use or asthma. She improved with diuresis and was discharged home but was unfortunately readmitted 2 weeks later, this time with some cough and associated fever. Her bilateral infiltrates were asymmetric and she was again treated for possible pneumonia.   She has been evaluated by cardiology and has an echocardiogram that showed diastolic dysfunction with preserved LVEF. She had mild TR and her right ventricular function was felt to be normal. She was discharged on home oxygen but has weaned herself off of this. Her exercise tolerance and overall pulmonary status has improved with diuresis and better blood pressure control. She presents now for evaluation of possible underlying lung disease. She is much improved and feels like she is approaching her previous baseline.  04/28/13 Post Hospital follow up  Patient returns for a two-week post hospital followup. She was admitted July 24 of the 29th for bilateral, multifocal pneumonia and sinusitis  CT chest [facility] 7/20 >> diffuse, patchy b/l ASD, small hiatal  hernia  CT sinus [facility]() 7/20 >> ethmoid/Rt frontal sinus opacification, mild/diffuse white matter changes  [facility] on [facility]13 with complaints of SOB x 1 week associated with shaking and chills. The patient was noted to initially have O2 sats in the low 80's on RA with CT findings suggestive of multifocal pneumonia vs edema. The patient was started on Rocephin and azithromycin. The patient was ultimately transitioned to Levaquin and the patient was felt to be in acute heart failure and continued on aggressive diuresis with q12 IV lasix. The patient was subsequently transferred to [facility] for further eval.  Discharged on levaquin which she has completed. Feeling better but still weak.  CXR today is improved with decreasing bilateral infiltrates.  She denies any hemoptysis, orthopnea, PND, or increased leg swelling  ROV 05/12/13 -- follows up for hospitalization w acute resp failure and infiltrates, dx as PNA vs pulm edema.  Returns feeling better. Better functional capacity. Feels closer to baseline but not quite there. She has had some intermittent cough, white sputum.   ROV 01/02/16 -- 75 year old woman, never smoker with hypertension who I saw in 2014 when she was admitted to the hospital with bilateral infiltrates and hypoxemic respiratory failure that were ultimately felt to be due to acute on chronic diastolic heart failure. There is also some consideration for possible COPD although as mentioned she's never smoked. She weaned herself off of oxygen after improving. She's never had liver function testing. She is referred back today after being admitted again with dyspnea, cough. She was dx with Flu. She was d/c 3/13 on O2, was able to stop this (based on adequate Spo2). She is referred back for eval  of freq bronchitis, some evidence for asthma type pattern.   Review of Systems  Reviewed in detail , neg except HPI   Objective:  Physical Exam Filed Vitals:    01/02/16 1120  BP: 110/82  Pulse: 54  Height:  (1.575 m)  Weight: 152 lb (68.947 kg)  SpO2: 96%   Gen: Pleasant, elderly woman  ENT: No lesions,  mouth clear,  oropharynx clear, no postnasal drip  Neck: No JVD, no TMG, no carotid bruits  Lungs: No use of accessory muscles, few right basilar inspiratory crackles, no wheezes  Cardiovascular: RRR, heart sounds normal, no murmur or gallops, no peripheral edema  Musculoskeletal: No deformities, no cyanosis or clubbing  Neuro: alert, non focal  Skin: Warm, no lesions or rashes     Assessment & Plan:   Dyspnea I believe her remote hospitalizations were related to diastolic CHF. Most recently she had influenza and certainly could've had a component of bronchospasm with this. Her history and frequent bronchitis do suggest COPD, possibly fixed asthma due to her inhaled exposure through work. I believe she needs full pulmonary function testing to define this. We will also perform a walking oximetry today to see if we can truly discontinue her home oxygen that was started after her hospitalization in March. I will see her after her pulmonary function testing to review and to consider bronchodilators. Chest x-ray today

## 2016-01-02 NOTE — Patient Instructions (Signed)
CXR today We will perform full pulmonary function testing at your next office visit.  Please continue your BP and diuretic medications as you are already taking them Follow with Dr Delton CoombesByrum next available with full PFT.

## 2016-01-02 NOTE — Addendum Note (Signed)
Addended by: Charlott HollerKIDD, Dorothyann Mourer W on: 01/02/2016 12:03 PM   Modules accepted: Orders

## 2016-04-07 ENCOUNTER — Ambulatory Visit: Payer: Commercial Managed Care - PPO | Admitting: Emergency Medicine

## 2016-06-30 ENCOUNTER — Ambulatory Visit: Payer: Commercial Managed Care - PPO | Admitting: Emergency Medicine

## 2017-11-01 DIAGNOSIS — F329 Major depressive disorder, single episode, unspecified: Secondary | ICD-10-CM | POA: Diagnosis not present

## 2017-11-01 DIAGNOSIS — I711 Thoracic aortic aneurysm, ruptured: Secondary | ICD-10-CM

## 2017-11-01 DIAGNOSIS — R001 Bradycardia, unspecified: Secondary | ICD-10-CM

## 2017-11-01 DIAGNOSIS — R0603 Acute respiratory distress: Secondary | ICD-10-CM | POA: Diagnosis not present

## 2017-11-01 DIAGNOSIS — J9601 Acute respiratory failure with hypoxia: Secondary | ICD-10-CM

## 2017-11-01 DIAGNOSIS — I10 Essential (primary) hypertension: Secondary | ICD-10-CM | POA: Diagnosis not present

## 2017-11-01 DIAGNOSIS — J189 Pneumonia, unspecified organism: Secondary | ICD-10-CM | POA: Diagnosis not present

## 2017-11-01 DIAGNOSIS — R0602 Shortness of breath: Secondary | ICD-10-CM

## 2017-11-01 DIAGNOSIS — E785 Hyperlipidemia, unspecified: Secondary | ICD-10-CM | POA: Diagnosis not present

## 2017-11-02 DIAGNOSIS — F329 Major depressive disorder, single episode, unspecified: Secondary | ICD-10-CM | POA: Diagnosis not present

## 2017-11-02 DIAGNOSIS — I10 Essential (primary) hypertension: Secondary | ICD-10-CM | POA: Diagnosis not present

## 2017-11-02 DIAGNOSIS — R0603 Acute respiratory distress: Secondary | ICD-10-CM | POA: Diagnosis not present

## 2017-11-02 DIAGNOSIS — J9601 Acute respiratory failure with hypoxia: Secondary | ICD-10-CM | POA: Diagnosis not present

## 2017-11-02 DIAGNOSIS — J189 Pneumonia, unspecified organism: Secondary | ICD-10-CM | POA: Diagnosis not present

## 2017-11-02 DIAGNOSIS — E785 Hyperlipidemia, unspecified: Secondary | ICD-10-CM | POA: Diagnosis not present

## 2017-11-02 DIAGNOSIS — I711 Thoracic aortic aneurysm, ruptured: Secondary | ICD-10-CM | POA: Diagnosis not present

## 2017-11-02 DIAGNOSIS — R001 Bradycardia, unspecified: Secondary | ICD-10-CM | POA: Diagnosis not present

## 2017-11-02 DIAGNOSIS — R0602 Shortness of breath: Secondary | ICD-10-CM | POA: Diagnosis not present

## 2017-11-03 DIAGNOSIS — I10 Essential (primary) hypertension: Secondary | ICD-10-CM | POA: Diagnosis not present

## 2017-11-03 DIAGNOSIS — I711 Thoracic aortic aneurysm, ruptured: Secondary | ICD-10-CM | POA: Diagnosis not present

## 2017-11-03 DIAGNOSIS — R0603 Acute respiratory distress: Secondary | ICD-10-CM | POA: Diagnosis not present

## 2017-11-03 DIAGNOSIS — R001 Bradycardia, unspecified: Secondary | ICD-10-CM | POA: Diagnosis not present

## 2017-11-03 DIAGNOSIS — E785 Hyperlipidemia, unspecified: Secondary | ICD-10-CM | POA: Diagnosis not present

## 2017-11-03 DIAGNOSIS — F329 Major depressive disorder, single episode, unspecified: Secondary | ICD-10-CM | POA: Diagnosis not present

## 2017-11-03 DIAGNOSIS — J9601 Acute respiratory failure with hypoxia: Secondary | ICD-10-CM | POA: Diagnosis not present

## 2017-11-03 DIAGNOSIS — R0602 Shortness of breath: Secondary | ICD-10-CM | POA: Diagnosis not present

## 2017-11-03 DIAGNOSIS — J189 Pneumonia, unspecified organism: Secondary | ICD-10-CM | POA: Diagnosis not present

## 2017-11-04 DIAGNOSIS — J9601 Acute respiratory failure with hypoxia: Secondary | ICD-10-CM | POA: Diagnosis not present

## 2017-11-04 DIAGNOSIS — R0602 Shortness of breath: Secondary | ICD-10-CM | POA: Diagnosis not present

## 2017-11-04 DIAGNOSIS — J189 Pneumonia, unspecified organism: Secondary | ICD-10-CM | POA: Diagnosis not present

## 2017-11-04 DIAGNOSIS — R0603 Acute respiratory distress: Secondary | ICD-10-CM | POA: Diagnosis not present

## 2018-09-06 DIAGNOSIS — J9601 Acute respiratory failure with hypoxia: Secondary | ICD-10-CM | POA: Diagnosis not present

## 2018-09-06 DIAGNOSIS — J180 Bronchopneumonia, unspecified organism: Secondary | ICD-10-CM

## 2018-09-07 DIAGNOSIS — J180 Bronchopneumonia, unspecified organism: Secondary | ICD-10-CM | POA: Diagnosis not present

## 2018-09-07 DIAGNOSIS — J9601 Acute respiratory failure with hypoxia: Secondary | ICD-10-CM | POA: Diagnosis not present

## 2018-09-08 DIAGNOSIS — J9601 Acute respiratory failure with hypoxia: Secondary | ICD-10-CM | POA: Diagnosis not present

## 2018-09-08 DIAGNOSIS — J180 Bronchopneumonia, unspecified organism: Secondary | ICD-10-CM | POA: Diagnosis not present

## 2018-09-09 DIAGNOSIS — J9601 Acute respiratory failure with hypoxia: Secondary | ICD-10-CM | POA: Diagnosis not present

## 2018-09-09 DIAGNOSIS — J180 Bronchopneumonia, unspecified organism: Secondary | ICD-10-CM | POA: Diagnosis not present

## 2019-02-26 DIAGNOSIS — I519 Heart disease, unspecified: Secondary | ICD-10-CM

## 2019-02-26 DIAGNOSIS — I10 Essential (primary) hypertension: Secondary | ICD-10-CM

## 2019-02-26 DIAGNOSIS — E785 Hyperlipidemia, unspecified: Secondary | ICD-10-CM

## 2019-02-26 DIAGNOSIS — F329 Major depressive disorder, single episode, unspecified: Secondary | ICD-10-CM

## 2019-02-26 DIAGNOSIS — R55 Syncope and collapse: Secondary | ICD-10-CM

## 2019-02-27 DIAGNOSIS — R55 Syncope and collapse: Secondary | ICD-10-CM

## 2019-03-22 NOTE — Progress Notes (Signed)
Cardiology Office Note:    Date:  03/23/2019   ID:  Beverly BaarsBrenda Hart, DOB 12-26-1940, MRN [id number]  PCP:  [name], [name], MD  Cardiologist:  Norman HerrlichBrian Olof Marcil, MD   Referring MD: [name], [name], MD  ASSESSMENT:    1. Hypertensive heart disease with heart failure (HCC)   2. Bradycardia   3. Chronic kidney disease, unspecified CKD stage    PLAN:    In order of problems listed above:  1. Hypertension stable continue current treatment including central active clonidine diuretic and ARB.  I am unsure if she truly has a diagnosis of heart failure and at this time if present its well compensated.  Her hospital proBNP level was not in the range of heart failure and her echocardiogram for all purposes is normal for age. 2. Bradycardia improved withdrawing beta-blocker and avoid rate slowing medications I think there is value in a 7-day ambulatory heart rhythm monitor to exclude significant sick sinus syndrome and if bradycardia is ongoing persistent we need to withdraw clonidine. 3. CKD is improved her last renal function was normal in the hospital  Next appointment as normal provided her monitor is reassuring   Medication Adjustments/Labs and Tests Ordered: Current medicines are reviewed at length with the patient today.  Concerns regarding medicines are outlined above.  Orders Placed This Encounter  Procedures  . LONG TERM MONITOR (3-14 DAYS)  . EKG 12-Lead   No orders of the defined types were placed in this encounter.    Chief complaint, bradycardia  History of Present Illness:    Beverly Hart is a 78 y.o. female who is being seen today for the evaluation of bradycardia taking a beta blocker at the request of [name], [name], MD.  She has a history of what previously was called heart failure but on further evaluation seen by Dr. Milas KocherBenSimon was felt to have been volume overload in the setting of pneumonia.  Other problems include hypertension stage III CKD.  Prior to the visit I  received records from [facility] admit 614 discharge 615 the patient's discharge diagnosis was near syncope, autonomic dysfunction there was no specific etiology found orthostatic vital signs were normal echocardiogram was normal for age systolic and diastolic function and no valvular abnormality of note she had a CT scan of the head that was normal she has felt to have stable hypertension depression was 1 of the discharge diagnoses.  Prior to discharge her CBC was normal renal function with a GFR of greater than 60 cc was normal creatinine 0.9 she had troponins assessed but did not have chest pain proBNP level was 466 not in the range of congestive heart failure and there is a notation that she had cerebrovascular duplex performed without a concerning stenosis.  During that admission to the hospital she was on a beta-blocker and the heart rate are fine documented on 02/27/2019 is 60 bpm.  She thinks she saw me likely in the range of 10 to 20 years ago in the office at one point in time.  Recently she has seen in the emergency room she tells me she thinks the reason she went to the hospital is more anxiety than anything else and there really is no discrete etiology from the hospitalization.  I reviewed the echocardiogram and its normal for age.  Since her beta-blocker was discontinued she has no complaints of palpitation weakness lightheadedness dizziness has had no edema shortness of breath or chest pain and has no known history of congenital  rheumatic heart disease.  She is quite reassured when I tell her at this time her echocardiogram does not show findings of heart failure.  Because of her previous bradycardia and the episode prompting ED visit I asked her to wear a ZIO monitor 1 week my expectation will be reassuring in that case I will see her back as needed.  She is comfortable with this approach in the future I would not give no rate slowing beta-blockers or calcium channel blockers.  If bradycardia  persist we need to withdrawal clonidine which can have a minor effect. Past Medical History:  Diagnosis Date  . Acute bronchitis   . Anemia   . Community acquired pneumonia   . Hypertension   . Myalgia and myositis   . Osteoarthritis   . Peripheral vascular disease (HCC)    noted on carotid ultrasound  . SOB (shortness of breath)   . Solitary thyroid nodule    hyperdense on the right lobe     Past Surgical History:  Procedure Laterality Date  . CHOLECYSTECTOMY  05/1999    Current Medications: Current Meds  Medication Sig  . albuterol (ACCUNEB) 0.63 MG/3ML nebulizer solution Inhale 1 ampule into the lungs every 6 (six) hours as needed.  Marland Kitchen. albuterol (VENTOLIN HFA) 108 (90 Base) MCG/ACT inhaler Inhale 1 puff into the lungs daily as needed.  Marland Kitchen. b complex vitamins tablet Take 2 tablets by mouth daily.  . calcium carbonate (OS-CAL) 600 MG TABS Take 600 mg by mouth daily.  . citalopram (CELEXA) 20 MG tablet Take 20 mg by mouth daily.   . cloNIDine (CATAPRES) 0.1 MG tablet Take 0.1 mg by mouth 2 (two) times daily.  . fish oil-omega-3 fatty acids 1000 MG capsule Take 1 g by mouth daily.  . furosemide (LASIX) 20 MG tablet Take 20 mg by mouth daily.  . Glucosamine-Chondroit-Vit C-Mn (GLUCOSAMINE 1500 COMPLEX) CAPS Take 1 capsule by mouth daily.  Marland Kitchen. losartan (COZAAR) 50 MG tablet Take 50 mg by mouth daily.  . Multiple Vitamin (MULTIVITAMIN WITH MINERALS) TABS Take 1 tablet by mouth daily.  . potassium chloride (K-DUR,KLOR-CON) 10 MEQ tablet Take 10 mEq by mouth daily.      Allergies:   Penicillins, Sulfa antibiotics, and Azithromycin   Social History   Socioeconomic History  . Marital status: Single    Spouse name: Not on file  . Number of children: Not on file  . Years of education: Not on file  . Highest education level: Not on file  Occupational History  . Occupation: SEWER    Employer: [employer]  Social Needs  . Financial resource strain: Not on file  . Food  insecurity    Worry: Not on file    Inability: Not on file  . Transportation needs    Medical: Not on file    Non-medical: Not on file  Tobacco Use  . Smoking status: Never Smoker  . Smokeless tobacco: Never Used  Substance and Sexual Activity  . Alcohol use: No  . Drug use: No  . Sexual activity: Not on file  Lifestyle  . Physical activity    Days per week: Not on file    Minutes per session: Not on file  . Stress: Not on file  Relationships  . Social Musicianconnections    Talks on phone: Not on file    Gets together: Not on file    Attends religious service: Not on file    Active member of club or organization: Not on  file    Attends meetings of clubs or organizations: Not on file    Relationship status: Not on file  Other Topics Concern  . Not on file  Social History Narrative  . Not on file     Family History: The patient's family history includes Breast cancer in her mother; Emphysema in her father.  ROS:   Review of Systems  Constitution: Positive for malaise/fatigue.  HENT: Negative.   Eyes: Negative.   Cardiovascular: Negative.   Respiratory: Negative.   Endocrine: Negative.   Hematologic/Lymphatic: Negative.   Skin: Negative.   Musculoskeletal: Negative.   Gastrointestinal: Negative.   Genitourinary: Negative.   Neurological: Positive for dizziness.  Psychiatric/Behavioral: The patient is nervous/anxious.   Allergic/Immunologic: Negative.    Please see the history of present illness.     All other systems reviewed and are negative.  EKGs/Labs/Other Studies Reviewed:    The following studies were reviewed today:   EKG:  EKG is  ordered today.  The ekg ordered today is personally reviewed and demonstrates sinus rhythm 66 bpm that EKG is normal   Physical Exam:    VS:  BP 122/78 (BP Location: Left Arm, Patient Position: Sitting, Cuff Size: Normal)   Pulse 66   Temp 98.3 F (36.8 C)   Ht 5\' 2"  (1.575 m)   Wt 143 lb 12.8 oz (65.2 kg)   BMI 26.30  kg/m     Wt Readings from Last 3 Encounters:  03/23/19 143 lb 12.8 oz (65.2 kg)  01/02/16 152 lb (68.9 kg)  06/19/13 155 lb 6.4 oz (70.5 kg)     GEN:  Well nourished, well developed in no acute distress HEENT: Normal NECK: No JVD; No carotid bruits LYMPHATICS: No lymphadenopathy CARDIAC: RRR, no murmurs, rubs, gallops RESPIRATORY:  Clear to auscultation without rales, wheezing or rhonchi  ABDOMEN: Soft, non-tender, non-distended MUSCULOSKELETAL:  No edema; No deformity  SKIN: Warm and dry NEUROLOGIC:  Alert and oriented x 3 PSYCHIATRIC:  Normal affect     Signed, Shirlee More, MD  03/23/2019 12:18 PM    [facility]

## 2019-03-23 ENCOUNTER — Ambulatory Visit (INDEPENDENT_AMBULATORY_CARE_PROVIDER_SITE_OTHER): Payer: Medicare Other | Admitting: Cardiology

## 2019-03-23 ENCOUNTER — Other Ambulatory Visit: Payer: Self-pay

## 2019-03-23 ENCOUNTER — Encounter: Payer: Self-pay | Admitting: Cardiology

## 2019-03-23 VITALS — BP 122/78 | HR 66 | Temp 98.3°F | Ht 62.0 in | Wt 143.8 lb

## 2019-03-23 DIAGNOSIS — N189 Chronic kidney disease, unspecified: Secondary | ICD-10-CM

## 2019-03-23 DIAGNOSIS — R001 Bradycardia, unspecified: Secondary | ICD-10-CM

## 2019-03-23 DIAGNOSIS — I110 Hypertensive heart disease with heart failure: Secondary | ICD-10-CM | POA: Diagnosis not present

## 2019-03-23 NOTE — Patient Instructions (Signed)
Medication Instructions:  Your physician recommends that you continue on your current medications as directed. Please refer to the Current Medication list given to you today.  If you need a refill on your cardiac medications before your next appointment, please call your pharmacy.   Lab work: None  If you have labs (blood work) drawn today and your tests are completely normal, you will receive your results only by: Marland Kitchen MyChart Message (if you have MyChart) OR . A paper copy in the mail If you have any lab test that is abnormal or we need to change your treatment, we will call you to review the results.  Testing/Procedures: You had an EKG today.   Your physician has recommended that you wear a ZIO monitor. ZIO monitors are medical devices that record the heart's electrical activity. Doctors most often use these monitors to diagnose arrhythmias. Arrhythmias are problems with the speed or rhythm of the heartbeat. The monitor is a small, portable device. You can wear one while you do your normal daily activities. This is usually used to diagnose what is causing palpitations/syncope (passing out). Wear for 7 days.   Follow-Up: At [facility], you and your health needs are our priority.  As part of our continuing mission to provide you with exceptional heart care, we have created designated Provider Care Teams.  These Care Teams include your primary Cardiologist (physician) and Advanced Practice Providers (APPs -  Physician Assistants and Nurse Practitioners) who all work together to provide you with the care you need, when you need it. You will need a follow up appointment as needed if symptoms worsen or fail to improve.

## 2019-03-30 ENCOUNTER — Other Ambulatory Visit (INDEPENDENT_AMBULATORY_CARE_PROVIDER_SITE_OTHER): Payer: Medicare Other

## 2019-03-30 DIAGNOSIS — R001 Bradycardia, unspecified: Secondary | ICD-10-CM | POA: Diagnosis not present

## 2021-05-15 DEATH — deceased
